# Patient Record
Sex: Male | Born: 1950
Health system: Southern US, Community
[De-identification: ages and names within clinical notes are randomized; demographics above are authoritative.]

## PROBLEM LIST (undated history)

## (undated) DIAGNOSIS — E785 Hyperlipidemia, unspecified: Secondary | ICD-10-CM

## (undated) DIAGNOSIS — G4733 Obstructive sleep apnea (adult) (pediatric): Secondary | ICD-10-CM

## (undated) DIAGNOSIS — I10 Essential (primary) hypertension: Secondary | ICD-10-CM

## (undated) HISTORY — DX: Hyperlipidemia, unspecified: E78.5

## (undated) HISTORY — DX: Obstructive sleep apnea (adult) (pediatric): G47.33

## (undated) HISTORY — PX: CHOLECYSTECTOMY: SHX55

## (undated) HISTORY — DX: Essential (primary) hypertension: I10

---

## 2008-12-21 ENCOUNTER — Encounter: Payer: Self-pay | Admitting: Internal Medicine

## 2009-12-06 ENCOUNTER — Encounter: Payer: Self-pay | Admitting: Internal Medicine

## 2010-01-03 ENCOUNTER — Ambulatory Visit: Payer: Self-pay | Admitting: Internal Medicine

## 2010-01-03 DIAGNOSIS — R609 Edema, unspecified: Secondary | ICD-10-CM | POA: Insufficient documentation

## 2010-01-03 DIAGNOSIS — R0609 Other forms of dyspnea: Secondary | ICD-10-CM

## 2010-01-05 ENCOUNTER — Encounter: Payer: Self-pay | Admitting: Internal Medicine

## 2010-01-09 ENCOUNTER — Encounter: Payer: Self-pay | Admitting: Internal Medicine

## 2010-01-10 LAB — CONVERTED CEMR LAB

## 2010-02-02 ENCOUNTER — Encounter: Payer: Self-pay | Admitting: Internal Medicine

## 2010-03-05 ENCOUNTER — Telehealth: Payer: Self-pay | Admitting: Internal Medicine

## 2010-08-28 NOTE — Consult Note (Signed)
Summary: [facility]  [facility]   Imported By: Marylou Mccoy 01/19/2010 17:50:36  _____________________________________________________________________  External Attachment:    Type:   Image     Comment:   External Document

## 2010-08-28 NOTE — Progress Notes (Signed)
Summary: results of bloodwork, treadmill, and echo  Phone Note Call from Patient   Caller: Patient [phone] Reason for Call: Talk to Nurse, Lab or Test Results Summary of Call: echo,.treadmill and bloodwork results -[phone] Initial call taken by: Glynda Jaeger,  March 05, 2010 9:47 AM  Follow-up for Phone Call        pt's test were ordered by Dr Terrilee Croak office, but Dr Gala Romney was suppose to get copies to reveiw and plan next step, have called Dr Terrilee Croak office and requested test results, they will fax (spoke w/Donna) Meredith Staggers, RN  March 05, 2010 4:55 PM   pt is aware we are waiting on Dr Terrilee Croak office to fax results, he states he talked w/them earlier and Lupita Leash stated she had faxed them, will look through paperwork if not found will call them in am to get refaxed Meredith Staggers, RN  March 07, 2010 4:56 PM   Additional Follow-up for Phone Call Additional follow up Details #1::        have received test results from Dr Terrilee Croak office, have placed on Dr Bensimhon's cart for review Meredith Staggers, RN  March 08, 2010 7:57 AM  DR Bensimhon reveiwed test (see append to his ov note) pt is aware Meredith Staggers, RN  March 13, 2010 1:00 PM

## 2010-08-28 NOTE — Letter (Signed)
Summary: [facility]  [facility]   Imported By: Marylou Mccoy 05/10/2010 15:08:58  _____________________________________________________________________  External Attachment:    Type:   Image     Comment:   External Document

## 2010-08-28 NOTE — Assessment & Plan Note (Signed)
Summary: np6/Hyperlipidemia-mb   Primary Provider:  Dr.  Blenda Nicely  CC:  new patient with hyperlipidemia.  Marland Kitchen  History of Present Illness: Kyle Fritz is a 60 y/o male with h/o morbid obesity, OSA, HTN and hyperlipidemia.   Referred by Dr. Blenda Nicely for further evaluation of dyspnea and LE edema. Denies any known h/o CAD. Had screening treadmill test about 5 years ago which was normal. Was supposed to do another stress test with Dr. Blenda Nicely a few months ago but got postponed due to equipment issues.   Has been having exertional dyspnea. Did walk test with Dr. Blenda Nicely and no hypoxia. SOB is episodic. Notes it most when walking. Can go about 1/2 mile then has to stop. Occasional chest tightness not cleraly related to exertion. Mild LE edema. No orthopnea or PND.   Current Medications (verified): 1)  Zetia 10 Mg Tabs (Ezetimibe) .... Once Daily 2)  Atenolol 25 Mg Tabs (Atenolol) .... Take One Tablet Once Daily 3)  Venlafaxine Hcl 150 Mg Xr24h-Cap (Venlafaxine Hcl) .... Once Daily 4)  Lipitor 20 Mg Tabs (Atorvastatin Calcium) .... Take One Tablet Once Daily 5)  Nasonex 50 Mcg/act Susp (Mometasone Furoate) .... 2 Sprays Each Nostril Each Morning 6)  Proventil Hfa 108 (90 Base) Mcg/act Aers (Albuterol Sulfate) .... 2 Puffs Daily  Allergies (verified): No Known Drug Allergies  Past History:  Past Surgical History: Last updated: 01/02/2010 Cholecystectomy  Family History: Last updated: 01/03/2010 Father: Hiatal hernia No CAD  Social History: Last updated: 01/03/2010 Full Time for phone company Married 3 adopted kids Tobacco Use - No.  Alcohol Use - no  Risk Factors: Smoking Status: never (01/02/2010)  Past Medical History: Morbid Obesity Hyperlipidemia Obstuctive Sleep Apnea Migraine headache Depression  Family History: Reviewed history from 01/02/2010 and no changes required. Father: Hiatal hernia No CAD  Social History: Reviewed history from 01/02/2010 and no changes  required. Full Time for phone company Married 3 adopted kids Tobacco Use - No.  Alcohol Use - no  Review of Systems       As per HPI and past medical history; otherwise all systems negative.   Vital Signs:  Patient profile:   60 year old male Height:      76 inches Weight:      341 pounds BMI:     41.66 Pulse rate:   103 / minute Pulse rhythm:   regular BP sitting:   134 / 74  (left arm) Cuff size:   large  Vitals Entered By: Judithe Modest CMA (January 03, 2010 4:16 PM)  Physical Exam  General:  ambulates with no resp difficulty HEENT: normal Neck: supple. JVP hard to evaluate due to size. Carotids 2+ bilat; no bruits. No lymphadenopathy or thryomegaly appreciated. Cor: PMI nondisplaced. Regular rate & rhythm. No rubs, gallops, murmur. Lungs: clear Abdomen: obese soft, nontender, nondistended.Good bowel sounds. Extremities: no cyanosis, clubbing, rash,  1+ edema Neuro: alert & orientedx3, cranial nerves grossly intact. moves all 4 extremities w/o difficulty. affect pleasant    Impression & Recommendations:  Problem # 1:  DYSPNEA (ICD-786.05) Likely multifactorial. Given risk factors and chest tightness, agree with Dr. Terrilee Croak plan for nuclear stress test. Would also get echo to assess LV and RV function (systolic and diastolic) and pulmonary pressures. If testing abnormal can f/u with cath as needed. Obviously, weight loss and increased physical activity will be key in alleviating his symptoms.   Problem # 2:  EDEMA (ICD-782.3)  Patient Instructions: 1)  Start Furosemide 20mg  every other day  2)  Start Potassium every other day  3)  Labs in [place] in 1 week (bmet, bnp) Prescriptions: POTASSIUM CHLORIDE CRYS CR 20 MEQ CR-TABS (POTASSIUM CHLORIDE CRYS CR) Take one tablet by mouth every other day  #30 x 6   Entered by:   Meredith Staggers, RN   Authorized by:   Dolores Patty, MD, [facility]   Signed by:   Meredith Staggers, RN on 01/03/2010   Method used:    Electronically to        [facility]. [phone]* (retail)       [address]       [place], [place]  [address]       Ph: [phone] or [phone]       Fax: [phone]   RxID:   [id number] FUROSEMIDE 20 MG TABS (FUROSEMIDE) Take one tablet by mouth every other day  #30 x 6   Entered by:   Meredith Staggers, RN   Authorized by:   Dolores Patty, MD, [facility]   Signed by:   Meredith Staggers, RN on 01/03/2010   Method used:   Electronically to        [facility] [phone]* (retail)       [address]       [place], [place]  [address]       Ph: [phone] or [phone]       Fax: [phone]   RxID:   [id number]   Appended Document: np6/Hyperlipidemia-mb Studies from Dr. Terrilee Croak office:  ECHO 02/26/10: poor quality due to habitus. LVEF 70%. RV not commented on. LA normal size. TV not well seen.  CPX 02/02/2010: pVO2 12.4 (corrected to BMI 30 = 18 ml/kg/min). stress ECG normal  FEV1 = 2.19 (55%)  FVC 2.49 (47%)   Overall: Dyspnea seems to be most related to obesity and associated restrictive lung physiology. No evidence of ischemia on exercise stress.   Plan: Recommend aggressive wt loss program with diet and exercise. Consider repeat echo in 6 months if symptoms persist.   Appended Document: np6/Hyperlipidemia-mb pt aware

## 2010-08-28 NOTE — Letter (Signed)
Summary: [facility]  [facility]   Imported By: Marylou Mccoy 01/19/2010 17:55:56  _____________________________________________________________________  External Attachment:    Type:   Image     Comment:   External Document

## 2016-09-04 ENCOUNTER — Ambulatory Visit (INDEPENDENT_AMBULATORY_CARE_PROVIDER_SITE_OTHER): Payer: 59 | Admitting: Internal Medicine

## 2016-09-04 ENCOUNTER — Encounter: Payer: Self-pay | Admitting: Internal Medicine

## 2016-09-04 VITALS — BP 150/80 | HR 75 | Ht 76.0 in | Wt 367.0 lb

## 2016-09-04 DIAGNOSIS — R0609 Other forms of dyspnea: Secondary | ICD-10-CM

## 2016-09-04 MED ORDER — FAMOTIDINE 20 MG PO TABS: ORAL_TABLET | ORAL | 11 refills | Status: DC

## 2016-09-04 MED ORDER — PANTOPRAZOLE SODIUM 40 MG PO TBEC: 40.0000 mg | DELAYED_RELEASE_TABLET | Freq: Every day | ORAL | 2 refills | Status: DC

## 2016-09-04 NOTE — Patient Instructions (Addendum)
Change the symbicort to where you only take it when you need it   Pantoprazole (protonix) 40 mg   Take  30-60 min before first meal of the day and Pepcid (famotidine)  20 mg one @  bedtime until return to office - this is the best way to tell whether stomach acid is contributing to your problem.    GERD (REFLUX)  is an extremely common cause of respiratory symptoms just like yours , many times with no obvious heartburn at all    It can be treated with medication, but also with lifestyle changes including elevation of the head of your bed (ideally with 6 inch  bed blocks),  Smoking cessation, avoidance of late meals, excessive alcohol, and avoid fatty foods, chocolate, peppermint, colas, red wine, and acidic juices such as orange juice.  NO MINT OR MENTHOL PRODUCTS SO NO COUGH DROPS   USE SUGARLESS CANDY INSTEAD (Jolley ranchers or Stover's or Life Savers) or even ice chips will also do - the key is to swallow to prevent all throat clearing. NO OIL BASED VITAMINS - use powdered substitutes.    Please schedule a follow up office visit in 4 weeks, sooner if needed  Cxr/ labs ordered 09/04/2016 but did not go > will have him do on return

## 2016-09-04 NOTE — Progress Notes (Signed)
Subjective:     Patient ID: Kyle Fritz, male   DOB: February 12, 1951,   MRN: [id number]  HPI  67 yowm  Quit smoking 1982 formerly jogged and did fine until around 2015 with onset on doe with activity but fine at hs with cpap per Chodri and progressive decline since so referred to pulmonary clinic 09/04/2016 by Dr  Helene Kelp.   09/04/2016 1st [facility] office visit/ Wert   Chief Complaint  Patient presents with  . Pulmonary Consult    Referred by Dr. Helene Kelp. Pt c/o SOB for the past several months. He states that he feels SOB "all the time" with or without any exertion.    onset around 2015 p maybe 10-15 lb gain assoc only with cough= intermittent daytime dry hack day > noct assoc with daytime nasal congestion and lots of white mucus production each am Started on symb sev years prior to [place] sporadically  And then one week prior to [place] to symb 160 2bid no better flonase helps nasal symptoms in spring and fall  Cp eval was 2017 with neg cath and has not recurred - midline  Thru back  Hot or cold food causes choking    No obvious day to day or daytime variability or assoc excess/ purulent sputum or mucus plugs or hemoptysis or  chest tightness, subjective wheeze or overt sinus or hb symptoms. No unusual exp hx or h/o childhood pna/ asthma or knowledge of premature birth.  Sleeping ok without nocturnal  or early am exacerbation  of respiratory  c/o's or need for noct saba. Also denies any obvious fluctuation of symptoms with weather or environmental changes or other aggravating or alleviating factors except as outlined above   Current Medications, Allergies, Complete Past Medical History, Past Surgical History, Family History, and Social History were reviewed in [employer] record.  ROS  The following are not active complaints unless bolded sore throat, dysphagia, dental problems, itching, sneezing,  nasal congestion or excess/ purulent secretions, ear ache,   fever,  chills, sweats, unintended wt loss, classically pleuritic or exertional cp,  orthopnea pnd or leg swelling, presyncope, palpitations, abdominal pain, anorexia, nausea, vomiting, diarrhea  or change in bowel or bladder habits, change in stools or urine, dysuria,hematuria,  rash, arthralgias, visual complaints, headache, numbness, weakness or ataxia or problems with walking or coordination,  change in mood/affect or memory.        Commitment     Review of Systems     Objective:   Physical Exam amb wm nad  Wt Readings from Last 3 Encounters:  09/04/16 (!) 367 lb (166.5 kg)  01/03/10 (!) 341 lb (154.7 kg)    Vital signs reviewed - Note on arrival 02 sats  95% on RA     HEENT: nl dentition, turbinates, and oropharynx. Nl external ear canals without cough reflex Modified Mallampati Score =   2    NECK :  without JVD/Nodes/TM/ nl carotid upstrokes bilaterally   LUNGS: no acc muscle use,  Nl contour chest which is clear to A and P bilaterally without cough on insp or exp maneuvers   CV:  RRR  no s3 or murmur or increase in P2, nad no edema   ABD:  soft and nontender with nl inspiratory excursion in the supine position. No bruits or organomegaly appreciated, bowel sounds nl  MS:  Nl gait/ ext warm without deformities, calf tenderness, cyanosis or clubbing No obvious joint restrictions   SKIN: warm  and dry without lesions    NEURO:  alert, approp, nl sensorium with  no motor or cerebellar deficits apparent.     Cxr/ labs ordered 09/04/2016 but did not go > will have him do on return       Assessment:

## 2016-09-05 NOTE — Assessment & Plan Note (Addendum)
09/04/2016   Walked RA  2 laps @ 185 ft each stopped due to  Sob at fast pace, sats still 90%  - Spirometry 09/04/2016  FEV1 2.67 (61%)  Ratio 81     When respiratory symptoms begin or become refractory well after a patient reports complete smoking cessation,  Especially when this wasn't the case while they were smoking, as is the case here, a red flag is raised based on the work of Dr Kris Mouton which states:  if you quit smoking when your best day FEV1 is still well preserved it is highly unlikely you will progress to severe disease.  That is to say, once the smoking stops,  the symptoms should not suddenly erupt or markedly worsen.  If so, the differential diagnosis should include  obesity/deconditioning,  LPR/Reflux/Aspiration syndromes,  occult CHF, or  especially side effect of medications commonly used in this population.    As expected, the pfts don't show significant copd though he could have an asthmatic component and could try on vs off symbicort to see what difference if any it makes  The more likely explanation for his cp with neg cards w/u and hacking non-prod day > noct cough is gerd assoc with obesity/ deconditioning but he needs to return to complete the w/u as did not go to cxr/ lab as requested  Total time devoted to counseling  > 50 % of initial 60 min office visit:  review case with pt/ discussion of options/alternatives/ personally creating written customized instructions  in presence of pt  then going over those specific  Instructions directly with the pt including how to use all of the meds but in particular covering each new medication in detail and the difference between the maintenance= "automatic" meds and the prns using an action plan format for the latter (If this problem/symptom => do that organization reading Left to right).  Please see AVS from this visit for a full list of these instructions which I personally wrote for this pt and  are unique to this visit.

## 2016-09-16 ENCOUNTER — Ambulatory Visit: Payer: 59

## 2016-09-20 ENCOUNTER — Ambulatory Visit
Admission: RE | Admit: 2016-09-20 | Discharge: 2016-09-20 | Disposition: A | Discharge status: Home / Self Care | Payer: 59 | Source: Ambulatory Visit | Attending: Internal Medicine | Admitting: Internal Medicine

## 2016-09-20 DIAGNOSIS — R0609 Other forms of dyspnea: Principal | ICD-10-CM

## 2016-10-03 ENCOUNTER — Other Ambulatory Visit (INDEPENDENT_AMBULATORY_CARE_PROVIDER_SITE_OTHER): Payer: 59

## 2016-10-03 ENCOUNTER — Ambulatory Visit (INDEPENDENT_AMBULATORY_CARE_PROVIDER_SITE_OTHER)
Admission: RE | Admit: 2016-10-03 | Discharge: 2016-10-03 | Disposition: A | Discharge status: Home / Self Care | Payer: 59 | Source: Ambulatory Visit | Attending: Internal Medicine | Admitting: Internal Medicine

## 2016-10-03 ENCOUNTER — Ambulatory Visit (INDEPENDENT_AMBULATORY_CARE_PROVIDER_SITE_OTHER): Payer: 59 | Admitting: Internal Medicine

## 2016-10-03 ENCOUNTER — Encounter: Payer: Self-pay | Admitting: Internal Medicine

## 2016-10-03 VITALS — BP 134/84 | HR 60 | Ht 76.0 in | Wt 361.0 lb

## 2016-10-03 DIAGNOSIS — R0609 Other forms of dyspnea: Secondary | ICD-10-CM

## 2016-10-03 LAB — BASIC METABOLIC PANEL
BUN: 21 mg/dL (ref 6–23)
CHLORIDE: 104 meq/L (ref 96–112)
CO2: 29 meq/L (ref 19–32)
CREATININE: 0.99 mg/dL (ref 0.40–1.50)
Calcium: 10.1 mg/dL (ref 8.4–10.5)
GFR: 80.78 mL/min (ref >60.00)
Glucose, Bld: 119 mg/dL — ABNORMAL HIGH (ref 70–99)
Potassium: 4.9 mEq/L (ref 3.5–5.1)
SODIUM: 140 meq/L (ref 135–145)

## 2016-10-03 LAB — CBC WITH DIFFERENTIAL/PLATELET
BASOS PCT: 0.8 % (ref 0.0–3.0)
Basophils Absolute: 0.1 10*3/uL (ref 0.0–0.1)
EOS ABS: 0.2 10*3/uL (ref 0.0–0.7)
EOS PCT: 2.2 % (ref 0.0–5.0)
HCT: 42.2 % (ref 39.0–52.0)
HEMOGLOBIN: 14.4 g/dL (ref 13.0–17.0)
LYMPHS ABS: 2.1 10*3/uL (ref 0.7–4.0)
Lymphocytes Relative: 24.1 % (ref 12.0–46.0)
MCHC: 34.2 g/dL (ref 30.0–36.0)
MCV: 85.8 fl (ref 78.0–100.0)
MONO ABS: 0.6 10*3/uL (ref 0.1–1.0)
Monocytes Relative: 7.1 % (ref 3.0–12.0)
NEUTROS ABS: 5.7 10*3/uL (ref 1.4–7.7)
NEUTROS PCT: 65.8 % (ref 43.0–77.0)
PLATELETS: 206 10*3/uL (ref 150.0–400.0)
RBC: 4.91 Mil/uL (ref 4.22–5.81)
RDW: 14.9 % (ref 11.5–15.5)
WBC: 8.6 10*3/uL (ref 4.0–10.5)

## 2016-10-03 LAB — TSH: TSH: 1.94 u[IU]/mL (ref 0.35–4.50)

## 2016-10-03 LAB — NITRIC OXIDE: Nitric Oxide: 42

## 2016-10-03 LAB — D-DIMER, QUANTITATIVE (NOT AT ARMC): D DIMER QUANT: 1.65 ug{FEU}/mL — AB (ref <0.50)

## 2016-10-03 LAB — BRAIN NATRIURETIC PEPTIDE: Pro B Natriuretic peptide (BNP): 7 pg/mL (ref 0.0–100.0)

## 2016-10-03 NOTE — Patient Instructions (Addendum)
Please remember to go to the lab and x-ray department downstairs in the basement  for your tests - we will call you with the results when they are available.   Only use your albuterol as a rescue medication to be used if you can't catch your breath by resting or doing a relaxed purse lip breathing pattern.  - The less you use it, the better it will work when you need it. - Ok to use up to 2 puffs  every 4 hours if you must but call for immediate appointment if use goes up over your usual need - Don't leave home without it !!  (think of it like the spare tire for your car)   Weight control is simply a matter of calorie balance which needs to be tilted in your favor by eating less and exercising more.  To get the most out of exercise, you need to be continuously aware that you are short of breath, but never out of breath, for 30 minutes daily. As you improve, it will actually be easier for you to do the same amount of exercise  in  30 minutes so always push to the level where you are short of breath.  If this does not result in gradual weight reduction then I strongly recommend you see a nutritionist with a food diary x 2 weeks so that we can work out a negative calorie balance which is universally effective in steady weight loss programs.  Think of your calorie balance like you do your bank account where in this case you want the balance to go down so you must take in less calories than you burn up.  It's just that simple:  Hard to do, but easy to understand.  Good luck!   .Please schedule a follow up office visit in 6 weeks, call sooner if needed with full pfts on return

## 2016-10-03 NOTE — Progress Notes (Signed)
LMTCB on home number  ATC mobile and NA "not a working number"

## 2016-10-03 NOTE — Assessment & Plan Note (Addendum)
09/04/2016   Walked RA  2 laps @ 185 ft each stopped due to  Sob at fast pace, sats still 90%  - Spirometry 09/04/2016  FEV1 2.67 (61%)  Ratio 81 - 10/03/2016  Walked RA x 3 laps @ 185 ft each stopped due to  End of study,  desat to 89% with no sob at nl pace     - FENO 10/03/2016  =   42 off symbicort  Since 09/04/16  - D dimer 10/03/2016  = 1.65 >  CTa rec along with venous doppler   He is high risk occult dvt/ PE from severe obesity so no choice with elevated d dimer but proceed with above studies.  No evidence of thyroid dz/ chf/ anemia / renal dz or active asthma  causing sob   I had an extended discussion with the patient reviewing all relevant studies completed to date and  lasting 15 to 20 minutes of a 25 minute visit    Each maintenance medication was reviewed in detail including most importantly the difference between maintenance and prns and under what circumstances the prns are to be triggered using an action plan format that is not reflected in the computer generated alphabetically organized AVS.    Please see AVS for specific instructions unique to this visit that I personally wrote and verbalized to the the pt in detail and then reviewed with pt  by my nurse highlighting any  changes in therapy recommended at today's visit to their plan of care.

## 2016-10-03 NOTE — Assessment & Plan Note (Signed)
Body mass index is 43.94 kg/m.  Trending  Down/ encouraged Lab Results  Component Value Date   TSH 1.94 10/03/2016     Contributing to gerd risk/ doe/reviewed the need and the process to achieve and maintain neg calorie balance > defer f/u primary care including intermittently monitoring thyroid status

## 2016-10-03 NOTE — Progress Notes (Signed)
Spoke with pt and notified of results per Dr. Wert. Pt verbalized understanding and denied any questions. 

## 2016-10-03 NOTE — Progress Notes (Signed)
Subjective:     Patient ID: Kyle Fritz, male   DOB: 1950-09-28,   MRN: [id number]    Brief patient profile:  33 yowm  Quit smoking 1982 formerly jogged last around 2000 @ wt 230-240 and did fine until around 2015 with onset on doe with activity but fine at hs with cpap per Chodri and progressive decline since so referred to pulmonary clinic 09/04/2016 by Dr  Helene Kelp.     History of Present Illness  09/04/2016 1st [facility] office visit/ Greely Atiyeh   Chief Complaint  Patient presents with  . Pulmonary Consult    Referred by Dr. Helene Kelp. Pt c/o SOB for the past several months. He states that he feels SOB "all the time" with or without any exertion.    onset around 2015 p maybe 10-15 lb gain assoc only with cough= intermittent daytime dry hack day > noct assoc with daytime nasal congestion and lots of white mucus production each am Started on symb sev years prior to [place] sporadically  And then one week prior to [place] to symb 160 2bid no better flonase helps nasal symptoms in spring and fall  Cp eval was 2017 with neg cath and has not recurred - midline  Thru back  Hot or cold food causes choking  rec Change the symbicort to where you only take it when you need it Pantoprazole (protonix) 40 mg   Take  30-60 min before first meal of the day and Pepcid (famotidine)  20 mg one @  bedtime until return to office - this is the best way to tell whether stomach acid is contributing to your problem.   GERD diet   Please schedule a follow up office visit in 4 weeks, sooner if needed  Cxr/ labs ordered 09/04/2016 but did not go > will have him do on return     10/03/2016  f/u ov/Taylynn Easton re: unexplained sob  Chief Complaint  Patient presents with  . Follow-up    Breathing is unchanged. He has noticed cough and wheezing for the past several days. Cough is prod with clear sputum.  no change off symbicort  Sleeps fine on cpap  Using proventil bid not prn as rec  Doe = MMRC3 = can't walk 100 yards  even at a slow pace at a flat grade s stopping due to sob     No obvious day to day or daytime variability or assoc purulent sputum or mucus plugs or hemoptysis or cp or chest tightness, subjective wheeze or overt sinus or hb symptoms. No unusual exp hx or h/o childhood pna/ asthma or knowledge of premature birth.  Sleeping ok without nocturnal  or early am exacerbation  of respiratory  c/o's or need for noct saba. Also denies any obvious fluctuation of symptoms with weather or environmental changes or other aggravating or alleviating factors except as outlined above   Current Medications, Allergies, Complete Past Medical History, Past Surgical History, Family History, and Social History were reviewed in [employer] record.  ROS  The following are not active complaints unless bolded sore throat, dysphagia, dental problems, itching, sneezing,  nasal congestion or excess/ purulent secretions, ear ache,   fever, chills, sweats, unintended wt loss, classically pleuritic or exertional cp,  orthopnea pnd or leg swelling sym bilaterally, better in am's, presyncope, palpitations, abdominal pain, anorexia, nausea, vomiting, diarrhea  or change in bowel or bladder habits, change in stools or urine, dysuria,hematuria,  rash, arthralgias, visual complaints, headache,  numbness, weakness or ataxia or problems with walking or coordination,  change in mood/affect or memory.           Objective:  Physical Exam  amb wm nad  10/03/2016           361   09/04/16 (!) 367 lb (166.5 kg)  01/03/10 (!) 341 lb (154.7 kg)    Vital signs reviewed - Note on arrival 02 sats  97% on RA     HEENT: nl dentition, turbinates, and oropharynx. Nl external ear canals without cough reflex Modified Mallampati Score =   2    NECK :  without JVD/Nodes/TM/ nl carotid upstrokes bilaterally   LUNGS: no acc muscle use,  Nl contour chest which is clear to A and P bilaterally without cough on insp or exp  maneuvers   CV:  RRR  no s3 or murmur or increase in P2, nad  - trace pitting edema both legs   ABD:  soft and nontender with nl inspiratory excursion in the supine position. No bruits or organomegaly appreciated, bowel sounds nl  MS:  Nl gait/ ext warm without deformities, calf tenderness, cyanosis or clubbing No obvious joint restrictions   SKIN: warm and dry without lesions    NEURO:  alert, approp, nl sensorium with  no motor or cerebellar deficits apparent.      CXR PA and Lateral:   10/03/2016 :    I personally reviewed images and agree with radiology impression as follows:    The heart size and mediastinal contours are within normal limits. Both lungs are clear. No pneumothorax or pleural effusion is noted. The visualized skeletal structures are unremarkable.    Labs ordered/ reviewed:      Chemistry      Component Value Date/Time   NA 140 10/03/2016 0947   K 4.9 10/03/2016 0947   CL 104 10/03/2016 0947   CO2 29 10/03/2016 0947   BUN 21 10/03/2016 0947   CREATININE 0.99 10/03/2016 0947      Component Value Date/Time   CALCIUM 10.1 10/03/2016 0947        Lab Results  Component Value Date   WBC 8.6 10/03/2016   HGB 14.4 10/03/2016   HCT 42.2 10/03/2016   MCV 85.8 10/03/2016   PLT 206.0 10/03/2016     Lab Results  Component Value Date   DDIMER 1.65 (H) 10/03/2016      Lab Results  Component Value Date   TSH 1.94 10/03/2016     Lab Results  Component Value Date   PROBNP 7.0 10/03/2016                  Assessment:

## 2016-10-04 ENCOUNTER — Telehealth: Payer: Self-pay | Admitting: Internal Medicine

## 2016-10-04 ENCOUNTER — Ambulatory Visit (INDEPENDENT_AMBULATORY_CARE_PROVIDER_SITE_OTHER)
Admission: RE | Admit: 2016-10-04 | Discharge: 2016-10-04 | Disposition: A | Discharge status: Home / Self Care | Payer: 59 | Source: Ambulatory Visit | Attending: Internal Medicine | Admitting: Internal Medicine

## 2016-10-04 ENCOUNTER — Other Ambulatory Visit: Payer: Self-pay | Admitting: Internal Medicine

## 2016-10-04 DIAGNOSIS — R0609 Other forms of dyspnea: Secondary | ICD-10-CM | POA: Diagnosis not present

## 2016-10-04 LAB — RESPIRATORY ALLERGY PROFILE REGION II ~~LOC~~
Allergen, C. Herbarum, M2: <0.1 kU/L
Allergen, Cedar tree, t12: <0.1 kU/L
Allergen, Comm Silver Birch, t9: <0.1 kU/L
Allergen, Cottonwood, t14: <0.1 kU/L
Allergen, Mulberry, t76: <0.1 kU/L
Allergen, Oak,t7: <0.1 kU/L
Allergen, P. notatum, m1: <0.1 kU/L
Aspergillus fumigatus, m3: <0.1 kU/L
Bermuda Grass: <0.1 kU/L
Box Elder IgE: <0.1 kU/L
Cat Dander: <0.1 kU/L
Cockroach: <0.1 kU/L
Common Ragweed: <0.1 kU/L
IgE (Immunoglobulin E), Serum: 57 kU/L (ref <115)
Johnson Grass: <0.1 kU/L
Rough Pigweed  IgE: <0.1 kU/L
Sheep Sorrel IgE: <0.1 kU/L

## 2016-10-04 MED ORDER — IOPAMIDOL (ISOVUE-370) INJECTION 76%
80.0000 mL | Freq: Once | INTRAVENOUS | Status: AC | PRN
  Administered 2016-10-04: 80 mL via INTRAVENOUS

## 2016-10-04 NOTE — Progress Notes (Signed)
Spoke with pt and notified of results per Dr. Wert. Pt verbalized understanding and denied any questions. 

## 2016-10-04 NOTE — Telephone Encounter (Signed)
Notes Recorded by Tanda Rockers, MD on 10/04/2016 at 1:40 PM EST Call patient : Study is unremarkable, no change in recs --------------------------- Spoke with pt. He is aware of results. Nothing further was needed.

## 2016-10-07 ENCOUNTER — Ambulatory Visit (HOSPITAL_COMMUNITY)
Admission: RE | Admit: 2016-10-07 | Discharge: 2016-10-07 | Disposition: A | Discharge status: Home / Self Care | Payer: 59 | Source: Ambulatory Visit | Attending: Internal Medicine | Admitting: Internal Medicine

## 2016-10-07 DIAGNOSIS — R0609 Other forms of dyspnea: Secondary | ICD-10-CM | POA: Diagnosis present

## 2016-10-07 NOTE — Progress Notes (Signed)
*  PRELIMINARY RESULTS* Vascular Ultrasound Bilateral lower extremity venous duplex has been completed.  Preliminary findings: No evidence of deep vein thrombosis or baker's cysts bilaterally.  Small palpable hypoechoic area seen left lateral calf, unknown etiology.   Everrett Coombe 10/07/2016, 9:34 AM

## 2016-10-08 ENCOUNTER — Other Ambulatory Visit: Payer: Self-pay | Admitting: Internal Medicine

## 2016-10-08 DIAGNOSIS — R0609 Other forms of dyspnea: Principal | ICD-10-CM

## 2016-10-08 MED ORDER — PANTOPRAZOLE SODIUM 40 MG PO TBEC: 40.0000 mg | DELAYED_RELEASE_TABLET | Freq: Every day | ORAL | 3 refills | Status: DC

## 2016-10-08 MED ORDER — FAMOTIDINE 20 MG PO TABS: ORAL_TABLET | ORAL | 3 refills | Status: DC

## 2016-10-08 NOTE — Progress Notes (Signed)
Spoke with pt and notified of results per Dr. Wert. Pt verbalized understanding and denied any questions. 

## 2016-11-25 ENCOUNTER — Ambulatory Visit (INDEPENDENT_AMBULATORY_CARE_PROVIDER_SITE_OTHER): Payer: 59 | Admitting: Internal Medicine

## 2016-11-25 ENCOUNTER — Encounter: Payer: Self-pay | Admitting: Internal Medicine

## 2016-11-25 VITALS — BP 126/74 | HR 67 | Ht 74.5 in | Wt 351.0 lb

## 2016-11-25 DIAGNOSIS — R0609 Other forms of dyspnea: Secondary | ICD-10-CM | POA: Diagnosis not present

## 2016-11-25 LAB — PULMONARY FUNCTION TEST
DL/VA % PRED: 94 %
DL/VA: 4.58 ml/min/mmHg/L
DLCO UNC % PRED: 66 %
DLCO cor % pred: 66 %
DLCO cor: 25.45 ml/min/mmHg
DLCO unc: 25.52 ml/min/mmHg
FEF 25-75 POST: 3.13 L/s
FEF 25-75 Pre: 2.64 L/sec
FEF2575-%CHANGE-POST: 18 %
FEF2575-%Pred-Post: 98 %
FEF2575-%Pred-Pre: 82 %
FEV1-%CHANGE-POST: 4 %
FEV1-%Pred-Post: 70 %
FEV1-%Pred-Pre: 67 %
FEV1-Post: 2.83 L
FEV1-Pre: 2.71 L
FEV1FVC-%Change-Post: 5 %
FEV1FVC-%Pred-Pre: 107 %
FEV6-%Change-Post: 0 %
FEV6-%Pred-Post: 64 %
FEV6-%Pred-Pre: 65 %
FEV6-POST: 3.33 L
FEV6-Pre: 3.36 L
FEV6FVC-%Pred-Post: 105 %
FEV6FVC-%Pred-Pre: 105 %
FVC-%Change-Post: 0 %
FVC-%PRED-PRE: 62 %
FVC-%Pred-Post: 61 %
FVC-Post: 3.33 L
FVC-Pre: 3.36 L
PRE FEV1/FVC RATIO: 81 %
PRE FEV6/FVC RATIO: 100 %
Post FEV1/FVC ratio: 85 %
Post FEV6/FVC ratio: 100 %
RV % pred: 94 %
RV: 2.43 L
TLC % PRED: 75 %
TLC: 5.95 L

## 2016-11-25 NOTE — Assessment & Plan Note (Addendum)
09/04/2016   Walked RA  2  laps @ 185 ft each stopped due to  Sob at fast pace, sats still 90%  - Spirometry 09/04/2016  FEV1 2.67 (61%)  Ratio 81 - 10/03/2016  Walked RA x 3 laps @ 185 ft each stopped due to  End of study,  desat to 89% with no sob at nl pace     - FENO 10/03/2016  =   42 off symbicort  Since 09/04/16  - Allergy profile 10/03/16  >  Eos 0.2 /  IgE  57 RAST neg  - D dimer 10/03/2016  = 1.65 >> >  CTa  10/04/2016 1. No evidence of acute pulmonary embolus. 2. No acute findings in the chest aside from pulmonary atelectasis. 3. Incidental benign right adrenal adenoma >> > Venous dopplers neg bilaterally 10/07/16    - PFT's  11/25/2016  FEV1 2.83 (70 % ) ratio 85  p 4 % improvement from saba p nothing prior to study with DLCO  66/66c % corrects to 94  % for alv volume but ERV 18    I had an extended final summary discussion with the patient reviewing all relevant studies completed to date and  lasting 15 to 20 minutes of a 25 minute visit on the following issues:   Findings are c/w effects of obesity, not copd/ though difficult to rule out asthma   Ok to use prn albuterol   No pulmonary f/u needed

## 2016-11-25 NOTE — Progress Notes (Signed)
PFT done today. 

## 2016-11-25 NOTE — Progress Notes (Signed)
Subjective:     Patient ID: Kyle Fritz, male   DOB: [birth year],   MRN: [id number]    Brief patient profile:  73 yowm  Quit smoking 1982 formerly jogged last around 2000 @ wt 230-240 and did fine until around 2015 with onset on doe with activity but fine at hs with cpap per Chodri and progressive decline since so referred to pulmonary clinic 09/04/2016 by Dr  Helene Kelp.     History of Present Illness  09/04/2016 1st [facility] office visit/ Kyle Fritz   Chief Complaint  Patient presents with  . Pulmonary Consult    Referred by Dr. Helene Kelp. Pt c/o SOB for the past several months. He states that he feels SOB "all the time" with or without any exertion.    onset around 2015 p maybe 10-15 lb gain assoc only with cough= intermittent daytime dry hack day > noct assoc with daytime nasal congestion and lots of white mucus production each am Started on symb sev years prior to [place] sporadically  And then one week prior to [place] to symb 160 2bid no better flonase helps nasal symptoms in spring and fall  Cp eval was 2017 with neg cath and has not recurred - midline  Thru back  Hot or cold food causes choking  rec Change the symbicort to where you only take it when you need it Pantoprazole (protonix) 40 mg   Take  30-60 min before first meal of the day and Pepcid (famotidine)  20 mg one @  bedtime until return to office - this is the best way to tell whether stomach acid is contributing to your problem.   GERD diet   Please schedule a follow up office visit in 4 weeks, sooner if needed  Cxr/ labs ordered 09/04/2016 but did not go > will have him do on return     10/03/2016  f/u ov/Kyle Fritz re: unexplained sob  Chief Complaint  Patient presents with  . Follow-up    Breathing is unchanged. He has noticed cough and wheezing for the past several days. Cough is prod with clear sputum.  no change off symbicort  Sleeps fine on cpap  Using proventil bid not prn as rec  Doe = MMRC3 = can't walk 100 yards  even at a slow pace at a flat grade s stopping due to sob   rec Please remember to go to the lab and x-ray department downstairs in the basement  for your tests - we will call you with the results when they are available. Only use your albuterol as a rescue medication to be used if you can't catch your breath   Weight control is simply a matter of calorie balance    11/25/2016  f/u ov/Kyle Fritz re: obesity/ restrictive  Chief Complaint  Patient presents with  . Follow-up   No albuterol since last ov/ sleeping fine and improving ex activity tolerance with intended wt loss  No obvious day to day or daytime variability or assoc excess/ purulent sputum or mucus plugs or hemoptysis or cp or chest tightness, subjective wheeze or overt sinus or hb symptoms. No unusual exp hx or h/o childhood pna/ asthma or knowledge of premature birth.  Sleeping ok without nocturnal  or early am exacerbation  of respiratory  c/o's or need for noct saba. Also denies any obvious fluctuation of symptoms with weather or environmental changes or other aggravating or alleviating factors except as outlined above   Current Medications, Allergies, Complete Past  Medical History, Past Surgical History, Family History, and Social History were reviewed in [employer] record.  ROS  The following are not active complaints unless bolded sore throat, dysphagia, dental problems, itching, sneezing,  nasal congestion or excess/ purulent secretions, ear ache,   fever, chills, sweats, unintended wt loss, classically pleuritic or exertional cp,  orthopnea pnd or leg swelling, presyncope, palpitations, abdominal pain, anorexia, nausea, vomiting, diarrhea  or change in bowel or bladder habits, change in stools or urine, dysuria,hematuria,  rash, arthralgias, visual complaints, headache, numbness, weakness or ataxia or problems with walking or coordination,  change in mood/affect or memory.                       Objective:  Physical Exam  amb wm nad  11/25/2016         351  10/03/2016           361   09/04/16 (!) 367 lb (166.5 kg)  01/03/10 (!) 341 lb (154.7 kg)    Vital signs reviewed - Note on arrival 02 sats  98% on RA     HEENT: nl dentition, turbinates, and oropharynx. Nl external ear canals without cough reflex Modified Mallampati Score =   2    NECK :  without JVD/Nodes/TM/ nl carotid upstrokes bilaterally   LUNGS: no acc muscle use,  Nl contour chest which is clear to A and P bilaterally without cough on insp or exp maneuvers   CV:  RRR  no s3 or murmur or increase in P2, nad  - min bilateral pitting edema both legs elastic hose in place   ABD:  soft and nontender with nl inspiratory excursion in the supine position. No bruits or organomegaly appreciated, bowel sounds nl  MS:  Nl gait/ ext warm without deformities, calf tenderness, cyanosis or clubbing No obvious joint restrictions   SKIN: warm and dry without lesions    NEURO:  alert, approp, nl sensorium with  no motor or cerebellar deficits apparent.         Labs   reviewed:      Chemistry      Component Value Date/Time   NA 140 10/03/2016 0947   K 4.9 10/03/2016 0947   CL 104 10/03/2016 0947   CO2 29 10/03/2016 0947   BUN 21 10/03/2016 0947   CREATININE 0.99 10/03/2016 0947      Component Value Date/Time   CALCIUM 10.1 10/03/2016 0947        Lab Results  Component Value Date   WBC 8.6 10/03/2016   HGB 14.4 10/03/2016   HCT 42.2 10/03/2016   MCV 85.8 10/03/2016   PLT 206.0 10/03/2016     Lab Results  Component Value Date   DDIMER 1.65 (H) 10/03/2016      Lab Results  Component Value Date   TSH 1.94 10/03/2016     Lab Results  Component Value Date   PROBNP 7.0 10/03/2016             I personally reviewed images and agree with radiology impression as follows:  CTa Chest  10/04/16 1.  No evidence of acute pulmonary embolus. 2. No acute findings in the chest aside from pulmonary  atelectasis. 3. Incidental benign right adrenal adenoma.   Venous dopplers neg bilaterally 10/07/16 .       Assessment:   Outpatient Encounter Prescriptions as of 11/25/2016  Medication Sig  . atorvastatin (LIPITOR) 40 MG tablet Take 40 mg by  mouth daily.  . busPIRone (BUSPAR) 5 MG tablet Take 5 mg by mouth 2 (two) times daily.  Marland Kitchen desvenlafaxine (PRISTIQ) 100 MG 24 hr tablet Take 100 mg by mouth daily.  . famotidine (PEPCID) 20 MG tablet One at bedtime  . hydrochlorothiazide (MICROZIDE) 12.5 MG capsule Take 12.5 mg by mouth daily as needed.  . pantoprazole (PROTONIX) 40 MG tablet Take 1 tablet (40 mg total) by mouth daily. Take 30-60 min before first meal of the day  . traZODone (DESYREL) 100 MG tablet Take 100 mg by mouth at bedtime.  Marland Kitchen UNABLE TO FIND Med Name: CPAP   No facility-administered encounter medications on file as of 11/25/2016.

## 2016-11-25 NOTE — Assessment & Plan Note (Signed)
Body mass index is 44.46 kg/m.  -  trending down/ encouraged Lab Results  Component Value Date   TSH 1.94 10/03/2016     Contributing to gerd risk/ doe/reviewed the need and the process to achieve and maintain neg calorie balance > defer f/u primary care including intermittently monitoring thyroid status

## 2016-11-25 NOTE — Patient Instructions (Signed)
You do not have copd, probably don't have much asthma and most of the problem is related to the effects of the weight of your abdomen on your lung volumes  Ok to keep the albuterol on hand if can't catch your breath - if you find you start needing it more than a few times a week please return     If you are satisfied with your treatment plan,  let your doctor know and he/she can either refill your medications or you can return here when your prescription runs out.     If in any way you are not 100% satisfied,  please tell us.  If 100% better, tell your friends!  Pulmonary follow up is as needed

## 2017-04-21 DIAGNOSIS — J40 Bronchitis, not specified as acute or chronic: Secondary | ICD-10-CM | POA: Diagnosis not present

## 2017-04-21 DIAGNOSIS — Z6841 Body Mass Index (BMI) 40.0 and over, adult: Secondary | ICD-10-CM | POA: Diagnosis not present

## 2017-04-21 DIAGNOSIS — F418 Other specified anxiety disorders: Secondary | ICD-10-CM | POA: Diagnosis not present

## 2017-04-21 DIAGNOSIS — I10 Essential (primary) hypertension: Secondary | ICD-10-CM | POA: Diagnosis not present

## 2017-04-21 DIAGNOSIS — Z79899 Other long term (current) drug therapy: Secondary | ICD-10-CM | POA: Diagnosis not present

## 2017-04-21 DIAGNOSIS — E785 Hyperlipidemia, unspecified: Secondary | ICD-10-CM | POA: Diagnosis not present

## 2017-05-09 DIAGNOSIS — G4733 Obstructive sleep apnea (adult) (pediatric): Secondary | ICD-10-CM | POA: Diagnosis not present

## 2017-05-09 DIAGNOSIS — J310 Chronic rhinitis: Secondary | ICD-10-CM | POA: Diagnosis not present

## 2017-06-25 ENCOUNTER — Other Ambulatory Visit: Payer: Self-pay

## 2017-06-25 NOTE — Patient Outreach (Signed)
[facility]) Care Management  06/25/2017  Yadriel Kerrigan May 14, 1951 [id number]   [employer] High risk screen completed. No Care management needs identified.  Thea Silversmith, RN, MSN, [place] Coordinator Cell: [phone]

## 2017-06-28 DIAGNOSIS — J1000 Influenza due to other identified influenza virus with unspecified type of pneumonia: Secondary | ICD-10-CM | POA: Diagnosis not present

## 2017-06-28 DIAGNOSIS — J218 Acute bronchiolitis due to other specified organisms: Secondary | ICD-10-CM | POA: Diagnosis not present

## 2017-08-14 DIAGNOSIS — H524 Presbyopia: Secondary | ICD-10-CM | POA: Diagnosis not present

## 2017-10-30 DIAGNOSIS — Z6841 Body Mass Index (BMI) 40.0 and over, adult: Secondary | ICD-10-CM | POA: Diagnosis not present

## 2017-10-30 DIAGNOSIS — J189 Pneumonia, unspecified organism: Secondary | ICD-10-CM | POA: Diagnosis not present

## 2017-10-30 DIAGNOSIS — M25561 Pain in right knee: Secondary | ICD-10-CM | POA: Diagnosis not present

## 2017-11-06 DIAGNOSIS — M1711 Unilateral primary osteoarthritis, right knee: Secondary | ICD-10-CM | POA: Diagnosis not present

## 2017-11-06 DIAGNOSIS — M179 Osteoarthritis of knee, unspecified: Secondary | ICD-10-CM | POA: Insufficient documentation

## 2017-11-06 DIAGNOSIS — M25561 Pain in right knee: Secondary | ICD-10-CM | POA: Insufficient documentation

## 2017-11-06 DIAGNOSIS — G4733 Obstructive sleep apnea (adult) (pediatric): Secondary | ICD-10-CM | POA: Diagnosis not present

## 2017-11-06 DIAGNOSIS — J310 Chronic rhinitis: Secondary | ICD-10-CM | POA: Diagnosis not present

## 2017-11-20 DIAGNOSIS — M25561 Pain in right knee: Secondary | ICD-10-CM | POA: Diagnosis not present

## 2017-11-20 DIAGNOSIS — M1711 Unilateral primary osteoarthritis, right knee: Secondary | ICD-10-CM | POA: Diagnosis not present

## 2017-11-20 DIAGNOSIS — M545 Low back pain: Secondary | ICD-10-CM | POA: Diagnosis not present

## 2017-11-28 DIAGNOSIS — M2241 Chondromalacia patellae, right knee: Secondary | ICD-10-CM | POA: Diagnosis not present

## 2017-11-28 DIAGNOSIS — M25561 Pain in right knee: Secondary | ICD-10-CM | POA: Diagnosis not present

## 2017-12-05 DIAGNOSIS — M25561 Pain in right knee: Secondary | ICD-10-CM | POA: Diagnosis not present

## 2017-12-05 DIAGNOSIS — M1711 Unilateral primary osteoarthritis, right knee: Secondary | ICD-10-CM | POA: Diagnosis not present

## 2017-12-10 DIAGNOSIS — G4733 Obstructive sleep apnea (adult) (pediatric): Secondary | ICD-10-CM | POA: Diagnosis not present

## 2017-12-17 DIAGNOSIS — M25561 Pain in right knee: Secondary | ICD-10-CM | POA: Diagnosis not present

## 2017-12-17 DIAGNOSIS — M1711 Unilateral primary osteoarthritis, right knee: Secondary | ICD-10-CM | POA: Diagnosis not present

## 2017-12-23 DIAGNOSIS — G4733 Obstructive sleep apnea (adult) (pediatric): Secondary | ICD-10-CM | POA: Diagnosis not present

## 2018-01-01 DIAGNOSIS — M25561 Pain in right knee: Secondary | ICD-10-CM | POA: Diagnosis not present

## 2018-01-01 DIAGNOSIS — M1711 Unilateral primary osteoarthritis, right knee: Secondary | ICD-10-CM | POA: Diagnosis not present

## 2018-01-23 DIAGNOSIS — G4733 Obstructive sleep apnea (adult) (pediatric): Secondary | ICD-10-CM | POA: Diagnosis not present

## 2018-02-12 DIAGNOSIS — E785 Hyperlipidemia, unspecified: Secondary | ICD-10-CM | POA: Diagnosis not present

## 2018-02-12 DIAGNOSIS — Z6841 Body Mass Index (BMI) 40.0 and over, adult: Secondary | ICD-10-CM | POA: Diagnosis not present

## 2018-02-12 DIAGNOSIS — I10 Essential (primary) hypertension: Secondary | ICD-10-CM | POA: Diagnosis not present

## 2018-02-12 DIAGNOSIS — Z79899 Other long term (current) drug therapy: Secondary | ICD-10-CM | POA: Diagnosis not present

## 2018-02-12 DIAGNOSIS — Z1331 Encounter for screening for depression: Secondary | ICD-10-CM | POA: Diagnosis not present

## 2018-02-12 DIAGNOSIS — Z9181 History of falling: Secondary | ICD-10-CM | POA: Diagnosis not present

## 2018-02-12 DIAGNOSIS — F418 Other specified anxiety disorders: Secondary | ICD-10-CM | POA: Diagnosis not present

## 2018-02-12 DIAGNOSIS — Z1339 Encounter for screening examination for other mental health and behavioral disorders: Secondary | ICD-10-CM | POA: Diagnosis not present

## 2018-02-17 DIAGNOSIS — S60212A Contusion of left wrist, initial encounter: Secondary | ICD-10-CM | POA: Diagnosis not present

## 2018-02-18 DIAGNOSIS — S62111A Displaced fracture of triquetrum [cuneiform] bone, right wrist, initial encounter for closed fracture: Secondary | ICD-10-CM | POA: Diagnosis not present

## 2018-02-22 DIAGNOSIS — G4733 Obstructive sleep apnea (adult) (pediatric): Secondary | ICD-10-CM | POA: Diagnosis not present

## 2018-03-04 DIAGNOSIS — J310 Chronic rhinitis: Secondary | ICD-10-CM | POA: Diagnosis not present

## 2018-03-04 DIAGNOSIS — G4733 Obstructive sleep apnea (adult) (pediatric): Secondary | ICD-10-CM | POA: Diagnosis not present

## 2018-03-05 DIAGNOSIS — S62111D Displaced fracture of triquetrum [cuneiform] bone, right wrist, subsequent encounter for fracture with routine healing: Secondary | ICD-10-CM | POA: Diagnosis not present

## 2018-03-24 DIAGNOSIS — G4733 Obstructive sleep apnea (adult) (pediatric): Secondary | ICD-10-CM | POA: Diagnosis not present

## 2018-03-25 DIAGNOSIS — G4733 Obstructive sleep apnea (adult) (pediatric): Secondary | ICD-10-CM | POA: Diagnosis not present

## 2018-04-02 DIAGNOSIS — S62114D Nondisplaced fracture of triquetrum [cuneiform] bone, right wrist, subsequent encounter for fracture with routine healing: Secondary | ICD-10-CM | POA: Diagnosis not present

## 2018-04-15 IMAGING — DX DG CHEST 2V
2 series · 2 of 2 positions shown · non-contrast
Comparison: None.

CLINICAL DATA: Dyspnea on exertion.

EXAM:
CHEST  2 VIEW

[chest pa]
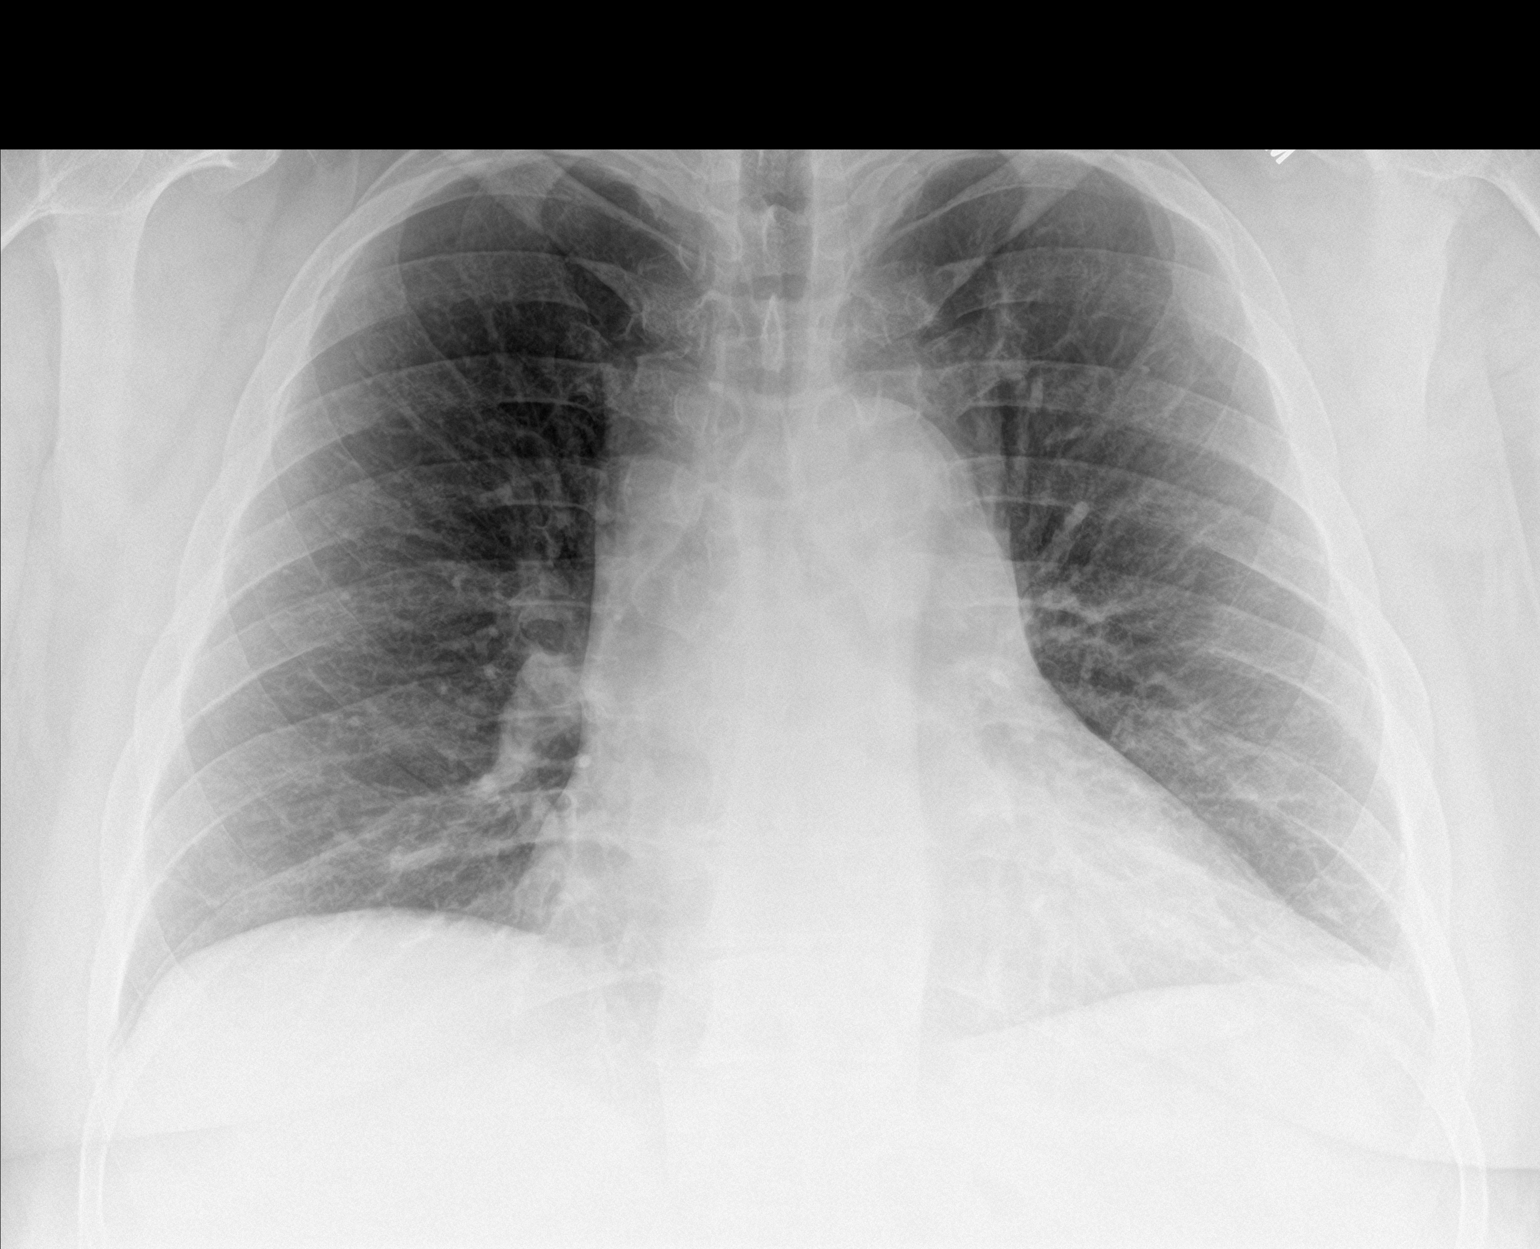

[chest lat]
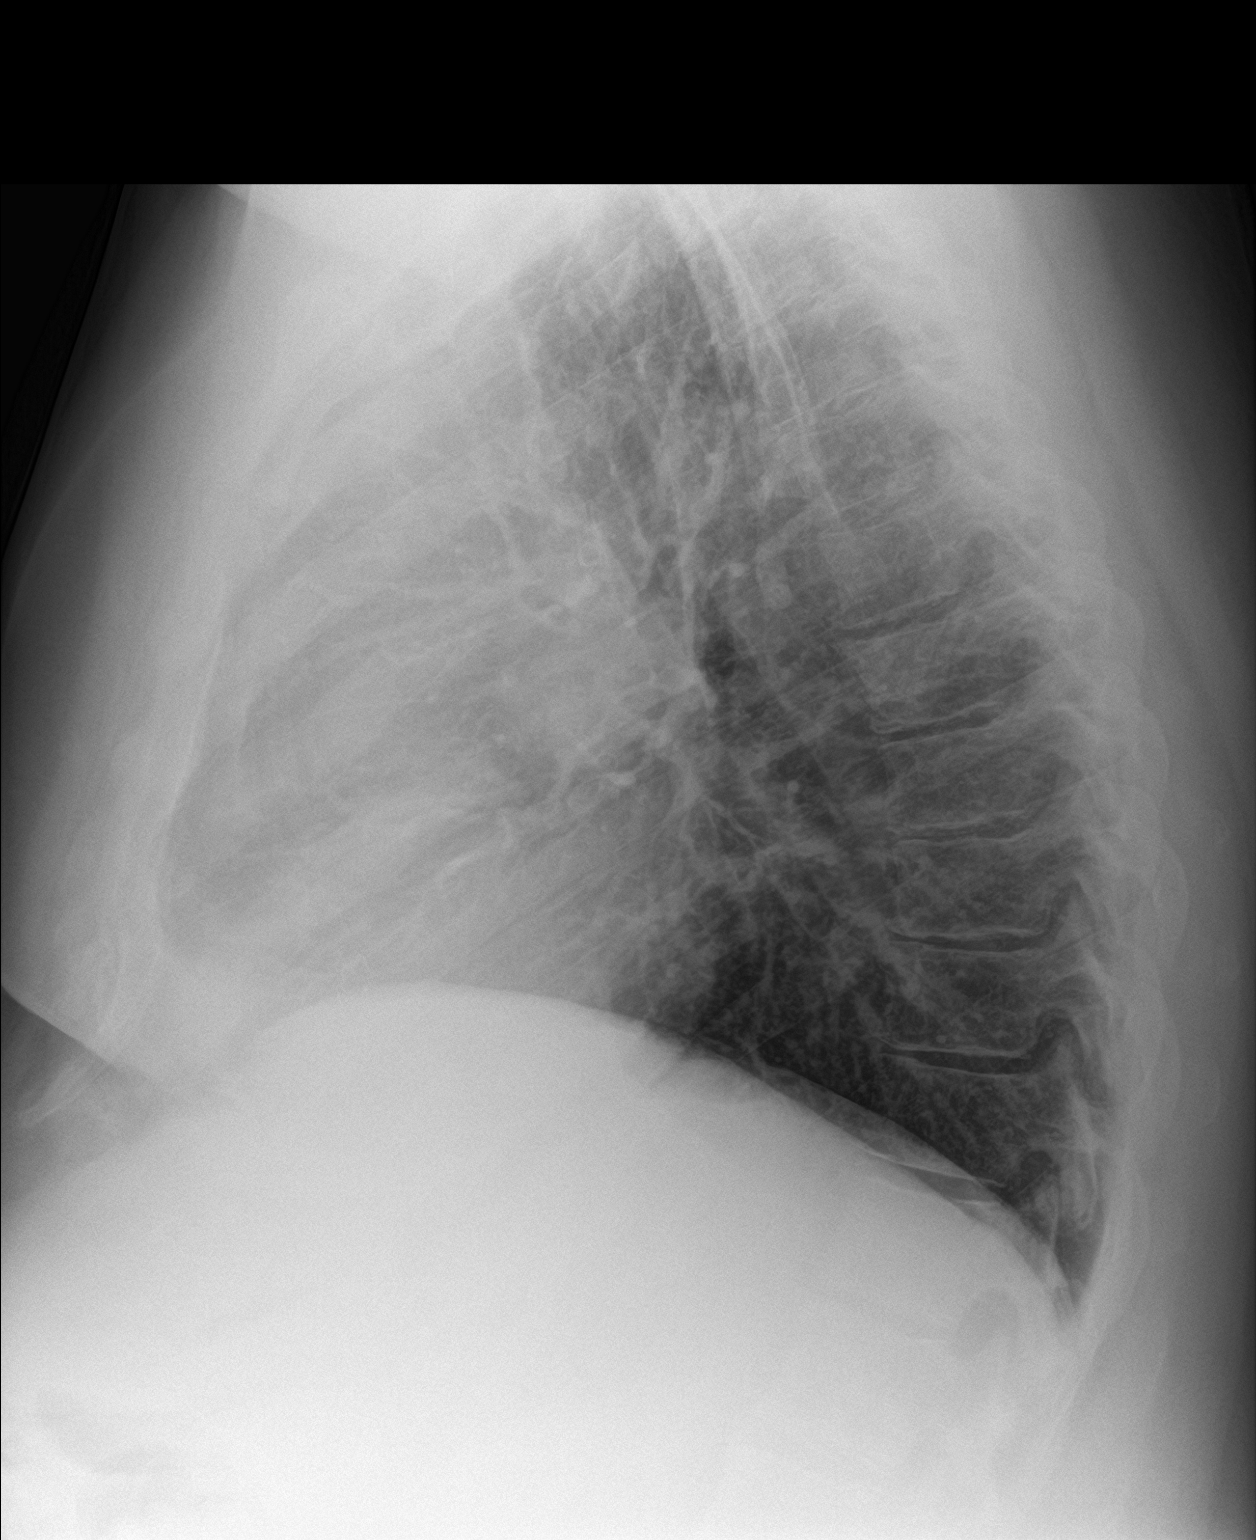

[2 of 2 positions shown; findings below may reference images not displayed]

FINDINGS: The heart size and mediastinal contours are within normal limits.
Both lungs are clear. No pneumothorax or pleural effusion is noted.
The visualized skeletal structures are unremarkable.
IMPRESSION: No active cardiopulmonary disease.

## 2018-04-25 DIAGNOSIS — G4733 Obstructive sleep apnea (adult) (pediatric): Secondary | ICD-10-CM | POA: Diagnosis not present

## 2018-05-05 DIAGNOSIS — S62114D Nondisplaced fracture of triquetrum [cuneiform] bone, right wrist, subsequent encounter for fracture with routine healing: Secondary | ICD-10-CM | POA: Diagnosis not present

## 2018-05-25 DIAGNOSIS — G4733 Obstructive sleep apnea (adult) (pediatric): Secondary | ICD-10-CM | POA: Diagnosis not present

## 2018-06-25 DIAGNOSIS — G4733 Obstructive sleep apnea (adult) (pediatric): Secondary | ICD-10-CM | POA: Diagnosis not present

## 2018-06-30 DIAGNOSIS — J069 Acute upper respiratory infection, unspecified: Secondary | ICD-10-CM | POA: Diagnosis not present

## 2018-07-06 DIAGNOSIS — J029 Acute pharyngitis, unspecified: Secondary | ICD-10-CM | POA: Diagnosis not present

## 2018-07-25 DIAGNOSIS — G4733 Obstructive sleep apnea (adult) (pediatric): Secondary | ICD-10-CM | POA: Diagnosis not present

## 2018-08-25 DIAGNOSIS — J310 Chronic rhinitis: Secondary | ICD-10-CM | POA: Diagnosis not present

## 2018-08-25 DIAGNOSIS — G4733 Obstructive sleep apnea (adult) (pediatric): Secondary | ICD-10-CM | POA: Diagnosis not present

## 2018-09-13 DIAGNOSIS — M545 Low back pain: Secondary | ICD-10-CM | POA: Diagnosis not present

## 2018-09-22 DIAGNOSIS — Z0000 Encounter for general adult medical examination without abnormal findings: Secondary | ICD-10-CM | POA: Diagnosis not present

## 2018-09-22 DIAGNOSIS — Z6841 Body Mass Index (BMI) 40.0 and over, adult: Secondary | ICD-10-CM | POA: Diagnosis not present

## 2018-09-22 DIAGNOSIS — M5416 Radiculopathy, lumbar region: Secondary | ICD-10-CM | POA: Diagnosis not present

## 2018-09-25 DIAGNOSIS — G4733 Obstructive sleep apnea (adult) (pediatric): Secondary | ICD-10-CM | POA: Diagnosis not present

## 2018-10-14 DIAGNOSIS — C44629 Squamous cell carcinoma of skin of left upper limb, including shoulder: Secondary | ICD-10-CM | POA: Diagnosis not present

## 2018-10-14 DIAGNOSIS — L821 Other seborrheic keratosis: Secondary | ICD-10-CM | POA: Diagnosis not present

## 2018-10-14 DIAGNOSIS — L578 Other skin changes due to chronic exposure to nonionizing radiation: Secondary | ICD-10-CM | POA: Diagnosis not present

## 2018-10-14 DIAGNOSIS — L820 Inflamed seborrheic keratosis: Secondary | ICD-10-CM | POA: Diagnosis not present

## 2018-11-19 DIAGNOSIS — M5416 Radiculopathy, lumbar region: Secondary | ICD-10-CM | POA: Diagnosis not present

## 2018-11-19 DIAGNOSIS — Z6841 Body Mass Index (BMI) 40.0 and over, adult: Secondary | ICD-10-CM | POA: Diagnosis not present

## 2019-02-15 DIAGNOSIS — Z6841 Body Mass Index (BMI) 40.0 and over, adult: Secondary | ICD-10-CM | POA: Diagnosis not present

## 2019-02-15 DIAGNOSIS — E785 Hyperlipidemia, unspecified: Secondary | ICD-10-CM | POA: Diagnosis not present

## 2019-02-15 DIAGNOSIS — Z9181 History of falling: Secondary | ICD-10-CM | POA: Diagnosis not present

## 2019-02-15 DIAGNOSIS — F418 Other specified anxiety disorders: Secondary | ICD-10-CM | POA: Diagnosis not present

## 2019-02-15 DIAGNOSIS — Z79899 Other long term (current) drug therapy: Secondary | ICD-10-CM | POA: Diagnosis not present

## 2019-02-15 DIAGNOSIS — I10 Essential (primary) hypertension: Secondary | ICD-10-CM | POA: Diagnosis not present

## 2019-02-23 DIAGNOSIS — G4733 Obstructive sleep apnea (adult) (pediatric): Secondary | ICD-10-CM | POA: Diagnosis not present

## 2019-03-01 ENCOUNTER — Other Ambulatory Visit: Payer: Self-pay

## 2019-06-18 DIAGNOSIS — J324 Chronic pansinusitis: Secondary | ICD-10-CM | POA: Diagnosis not present

## 2019-08-13 DIAGNOSIS — F418 Other specified anxiety disorders: Secondary | ICD-10-CM | POA: Diagnosis not present

## 2019-08-13 DIAGNOSIS — Z743 Need for continuous supervision: Secondary | ICD-10-CM | POA: Diagnosis not present

## 2019-08-13 DIAGNOSIS — M1990 Unspecified osteoarthritis, unspecified site: Secondary | ICD-10-CM | POA: Diagnosis not present

## 2019-08-13 DIAGNOSIS — R0789 Other chest pain: Secondary | ICD-10-CM | POA: Diagnosis not present

## 2019-08-13 DIAGNOSIS — G4733 Obstructive sleep apnea (adult) (pediatric): Secondary | ICD-10-CM | POA: Diagnosis not present

## 2019-08-13 DIAGNOSIS — R0602 Shortness of breath: Secondary | ICD-10-CM | POA: Diagnosis not present

## 2019-08-13 DIAGNOSIS — R079 Chest pain, unspecified: Secondary | ICD-10-CM | POA: Diagnosis not present

## 2019-08-13 DIAGNOSIS — R7989 Other specified abnormal findings of blood chemistry: Secondary | ICD-10-CM | POA: Diagnosis not present

## 2019-08-13 DIAGNOSIS — E7800 Pure hypercholesterolemia, unspecified: Secondary | ICD-10-CM | POA: Diagnosis not present

## 2019-08-13 DIAGNOSIS — I10 Essential (primary) hypertension: Secondary | ICD-10-CM | POA: Diagnosis not present

## 2019-08-13 DIAGNOSIS — Z79899 Other long term (current) drug therapy: Secondary | ICD-10-CM | POA: Diagnosis not present

## 2019-08-14 DIAGNOSIS — R079 Chest pain, unspecified: Secondary | ICD-10-CM | POA: Diagnosis not present

## 2019-08-15 DIAGNOSIS — R079 Chest pain, unspecified: Secondary | ICD-10-CM | POA: Diagnosis not present

## 2019-08-20 DIAGNOSIS — Z79899 Other long term (current) drug therapy: Secondary | ICD-10-CM | POA: Diagnosis not present

## 2019-08-20 DIAGNOSIS — F418 Other specified anxiety disorders: Secondary | ICD-10-CM | POA: Diagnosis not present

## 2019-08-20 DIAGNOSIS — Z6841 Body Mass Index (BMI) 40.0 and over, adult: Secondary | ICD-10-CM | POA: Diagnosis not present

## 2019-08-20 DIAGNOSIS — I10 Essential (primary) hypertension: Secondary | ICD-10-CM | POA: Diagnosis not present

## 2019-08-30 NOTE — Progress Notes (Deleted)
Cardiology Office Note:    Date:  08/30/2019   ID:  Kyle Fritz, DOB June 06, 1951, MRN [id number]  PCP:  Kyle Hipps, MD  Cardiologist:  Kyle More, MD    Referring MD: Kyle Hipps, MD    ASSESSMENT:    No diagnosis found. PLAN:    In order of problems listed above:  1. ***   Next appointment: ***   Medication Adjustments/Labs and Tests Ordered: Current medicines are reviewed at length with the patient today.  Concerns regarding medicines are outlined above.  No orders of the defined types were placed in this encounter.  No orders of the defined types were placed in this encounter.   No chief complaint on file.   History of Present Illness:    Kyle Fritz is a 69 y.o. male with a hx of *** last seen ***. Compliance with diet, lifestyle and medications: *** Past Medical History:  Diagnosis Date  . Hyperlipemia   . Hypertension   . OSA (obstructive sleep apnea)     *** The histories are not reviewed yet. Please review them in the "History" navigator section and refresh this [place].  Current Medications: No outpatient medications have been marked as taking for the 08/31/19 encounter (Appointment) with Kyle Priest, MD.     Allergies:   Patient has no known allergies.   Social History   Socioeconomic History  . Marital status: Married    Spouse name: Not on file  . Number of children: Not on file  . Years of education: Not on file  . Highest education level: Not on file  Occupational History  . Not on file  Tobacco Use  . Smoking status: Former Smoker    Packs/day: 2.00    Years: 20.00    Pack years: 40.00    Types: Cigarettes    Quit date: 07/29/1980    Years since quitting: 39.1  . Smokeless tobacco: Never Used  Substance and Sexual Activity  . Alcohol use: Not on file  . Drug use: Not on file  . Sexual activity: Not on file  Other Topics Concern  . Not on file  Social History Narrative  . Not on file   Social Determinants of  Health   Financial Resource Strain:   . Difficulty of Paying Living Expenses: Not on file  Food Insecurity:   . Worried About Charity fundraiser in the Last Year: Not on file  . Ran Out of Food in the Last Year: Not on file  Transportation Needs:   . Lack of Transportation (Medical): Not on file  . Lack of Transportation (Non-Medical): Not on file  Physical Activity:   . Days of Exercise per Week: Not on file  . Minutes of Exercise per Session: Not on file  Stress:   . Feeling of Stress : Not on file  Social Connections:   . Frequency of Communication with Friends and Family: Not on file  . Frequency of Social Gatherings with Friends and Family: Not on file  . Attends Religious Services: Not on file  . Active Member of Clubs or Organizations: Not on file  . Attends Archivist Meetings: Not on file  . Marital Status: Not on file     Family History: The patient's ***family history includes Breast cancer in his mother; Melanoma in his mother. ROS:   Please see the history of present illness.    All other systems reviewed and are negative.  EKGs/Labs/Other Studies Reviewed:  The following studies were reviewed today:  EKG:  EKG ordered today and personally reviewed.  The ekg ordered today demonstrates ***  Recent Labs: No results found for requested labs within last 8760 hours.  Recent Lipid Panel No results found for: CHOL, TRIG, HDL, CHOLHDL, VLDL, LDLCALC, LDLDIRECT  Physical Exam:    VS:  There were no vitals taken for this visit.    Wt Readings from Last 3 Encounters:  11/25/16 (!) 351 lb (159.2 kg)  10/03/16 (!) 361 lb (163.7 kg)  09/04/16 (!) 367 lb (166.5 kg)     GEN: *** Well nourished, well developed in no acute distress HEENT: Normal NECK: No JVD; No carotid bruits LYMPHATICS: No lymphadenopathy CARDIAC: ***RRR, no murmurs, rubs, gallops RESPIRATORY:  Clear to auscultation without rales, wheezing or rhonchi  ABDOMEN: Soft, non-tender,  non-distended MUSCULOSKELETAL:  No edema; No deformity  SKIN: Warm and dry NEUROLOGIC:  Alert and oriented x 3 PSYCHIATRIC:  Normal affect    Signed, Kyle More, MD  08/30/2019 12:57 PM    [facility]

## 2019-08-30 NOTE — Progress Notes (Signed)
Cardiology Office Note:    Date:  08/31/2019   ID:  [name], DOB [birth year], MRN [id number]  PCP:  Ronita Hipps, MD  Cardiologist:  Shirlee More, MD   Referring MD: Ronita Hipps, MD  ASSESSMENT:    1. Chest pain of uncertain etiology   2. Hyperlipidemia, unspecified hyperlipidemia type   3. Obstructive sleep apnea   4. Thyroid nodule    PLAN:    In order of problems listed above:  1. Pain was atypical nonanginal perfusion study was normal he has no coronary calcification at this time I would not perform coronary angiography.  The patient and his wife are reassured. 2. Continue a statin 3. Continue CPAP 4. Thyroid nodule duplex ordered he will follow up with Dr. Helene Kelp  Next appointment as needed   Medication Adjustments/Labs and Tests Ordered: Current medicines are reviewed at length with the patient today.  Concerns regarding medicines are outlined above.  No orders of the defined types were placed in this encounter.  No orders of the defined types were placed in this encounter.    Chief Complaint  Patient presents with  . Follow-up    Discuss stress test    History of Present Illness:    Kyle Fritz is a 69 y.o. male who is being seen today for the evaluation of chest pain at the request of Ronita Hipps, MD.  He was admitted to [facility] 08/13/2019 0 08/05/2019 with chest pain.  EKG was normal serial troponins were undetectable myocardial perfusion study with Lexiscan was normal.  His D-dimer is mildly elevated he underwent a CTA of the chest which showed no coronary artery calcification he did have an adrenal nodule which was a chronic finding left thyroid nodule 2.4 cm heterogeneous with recommended thyroid ultrasound.  Does not appear as if it was done during the hospitalization. It was it recommended discharge to see cardiology in follow-up there was no communication to me I was the position of the hospital and was seen by Dr. Haroldine Laws in 2011  for shortness of breath that he felt was related to obesity and associated restrictive lung disease.  Unfortunately left the  hospital an understanding of his myocardial perfusion study that was normal.  The symptoms preceding admission were very atypical nonanginal point localized chest pain waxed and waned unrelated to physical effort.  Known history of heart disease since discharge his medications were altered for anxiety and depression he has had no recurrent chest discomfort.  He was unaware that he had a thyroid nodule and at his request I will order an ultrasound a follow-up with his primary care physician.  I spoke with his wife by phone and reassured her that his perfusion study was normal and at this time I do not think I perform invasive procedures like coronary angiography.  He does have stable exertional shortness of breath due to lung disease and sleep apnea.  He has a history of previous orthopedic surgery related thrombophlebitis CTA of the chest showed no evidence of pulmonary embolism Past Medical History:  Diagnosis Date  . Hyperlipemia   . Hypertension   . OSA (obstructive sleep apnea)     Past Surgical History:  Procedure Laterality Date  . CHOLECYSTECTOMY      Current Medications: Current Meds  Medication Sig  . atorvastatin (LIPITOR) 40 MG tablet Take 40 mg by mouth daily.  . cyclobenzaprine (FLEXERIL) 10 MG tablet Take 10 mg by mouth 2 (two) times daily.  Marland Kitchen desvenlafaxine (  PRISTIQ) 100 MG 24 hr tablet Take 100 mg by mouth daily.     Allergies:   Patient has no known allergies.   Social History   Socioeconomic History  . Marital status: Married    Spouse name: Not on file  . Number of children: Not on file  . Years of education: Not on file  . Highest education level: Not on file  Occupational History  . Not on file  Tobacco Use  . Smoking status: Former Smoker    Packs/day: 2.00    Years: 20.00    Pack years: 40.00    Types: Cigarettes    Quit date:  07/29/1980    Years since quitting: 39.1  . Smokeless tobacco: Never Used  Substance and Sexual Activity  . Alcohol use: Never  . Drug use: Never  . Sexual activity: Not on file  Other Topics Concern  . Not on file  Social History Narrative  . Not on file   Social Determinants of Health   Financial Resource Strain:   . Difficulty of Paying Living Expenses: Not on file  Food Insecurity:   . Worried About Charity fundraiser in the Last Year: Not on file  . Ran Out of Food in the Last Year: Not on file  Transportation Needs:   . Lack of Transportation (Medical): Not on file  . Lack of Transportation (Non-Medical): Not on file  Physical Activity:   . Days of Exercise per Week: Not on file  . Minutes of Exercise per Session: Not on file  Stress:   . Feeling of Stress : Not on file  Social Connections:   . Frequency of Communication with Friends and Family: Not on file  . Frequency of Social Gatherings with Friends and Family: Not on file  . Attends Religious Services: Not on file  . Active Member of Clubs or Organizations: Not on file  . Attends Archivist Meetings: Not on file  . Marital Status: Not on file     Family History: The patient's family history includes Breast cancer in his mother; Melanoma in his mother.  ROS:   Review of Systems  Constitution: Negative.  HENT: Negative.   Eyes: Negative.   Cardiovascular: Positive for chest pain.  Respiratory: Positive for shortness of breath and snoring.   Endocrine: Negative.   Hematologic/Lymphatic: Negative.   Skin: Negative.   Musculoskeletal: Positive for joint pain.  Gastrointestinal: Negative.   Genitourinary: Negative.   Neurological: Negative.   Psychiatric/Behavioral: Positive for depression. The patient is nervous/anxious.   Allergic/Immunologic: Negative.    Please see the history of present illness.     All other systems reviewed and are negative.  EKGs/Labs/Other Studies Reviewed:    The  following studies were reviewed today:   EKG: I did not repeat an EKG today  Recent Labs: 08/13/19: Troponin 3 all undetectable Hgb 12.5 A1 c 6.1% D Dimer elevated 702 CMP norma;  Physical Exam:    VS:  BP 140/90 (BP Location: Right Arm, Patient Position: Sitting)   Pulse 67   Ht 6' 2.5" (1.892 m)   Wt (!) 358 lb (162.4 kg)   SpO2 94%   BMI 45.35 kg/m     Wt Readings from Last 3 Encounters:  08/31/19 (!) 358 lb (162.4 kg)  11/25/16 (!) 351 lb (159.2 kg)  10/03/16 (!) 361 lb (163.7 kg)     GEN: Marked obesity BMI exceeds 45 well nourished, well developed in no acute distress HEENT:  Normal NECK: No JVD; No carotid bruits LYMPHATICS: No lymphadenopathy CARDIAC: RRR, no murmurs, rubs, gallops RESPIRATORY:  Clear to auscultation without rales, wheezing or rhonchi  ABDOMEN: Soft, non-tender, non-distended MUSCULOSKELETAL:  No edema; No deformity  SKIN: Warm and dry NEUROLOGIC:  Alert and oriented x 3 PSYCHIATRIC:  Normal affect     Signed, Shirlee More, MD  08/31/2019 4:24 PM    [place]

## 2019-08-31 ENCOUNTER — Ambulatory Visit (INDEPENDENT_AMBULATORY_CARE_PROVIDER_SITE_OTHER): Payer: PPO | Admitting: Cardiology

## 2019-08-31 ENCOUNTER — Other Ambulatory Visit: Payer: Self-pay

## 2019-08-31 ENCOUNTER — Encounter: Payer: Self-pay | Admitting: Cardiology

## 2019-08-31 VITALS — BP 140/90 | HR 67 | Ht 74.5 in | Wt 358.0 lb

## 2019-08-31 DIAGNOSIS — E041 Nontoxic single thyroid nodule: Secondary | ICD-10-CM | POA: Diagnosis not present

## 2019-08-31 DIAGNOSIS — R079 Chest pain, unspecified: Secondary | ICD-10-CM | POA: Diagnosis not present

## 2019-08-31 DIAGNOSIS — R911 Solitary pulmonary nodule: Secondary | ICD-10-CM | POA: Insufficient documentation

## 2019-08-31 DIAGNOSIS — G4733 Obstructive sleep apnea (adult) (pediatric): Secondary | ICD-10-CM

## 2019-08-31 DIAGNOSIS — E785 Hyperlipidemia, unspecified: Secondary | ICD-10-CM | POA: Diagnosis not present

## 2019-08-31 NOTE — Patient Instructions (Signed)
Medication Instructions:  Your physician recommends that you continue on your current medications as directed. Please refer to the Current Medication list given to you today.  *If you need a refill on your cardiac medications before your next appointment, please call your pharmacy*  Lab Work: Your physician recommends that you have a Bmp drawn 7 days prior to your CT If you have labs (blood work) drawn today and your tests are completely normal, you will receive your results only by: Marland Kitchen MyChart Message (if you have MyChart) OR . A paper copy in the mail If you have any lab test that is abnormal or we need to change your treatment, we will call you to review the results.  Testing/Procedures: Your physician recommends that you have a Thyroid US and CT of chest performed.   Follow-Up: At [facility], you and your health needs are our priority.  As part of our continuing mission to provide you with exceptional heart care, we have created designated Provider Care Teams.  These Care Teams include your primary Cardiologist (physician) and Advanced Practice Providers (APPs -  Physician Assistants and Nurse Practitioners) who all work together to provide you with the care you need, when you need it.  Your next appointment:   AS needed in the future   The format for your next appointment:   In Person  Provider:   Shirlee More, MD

## 2019-09-07 DIAGNOSIS — E041 Nontoxic single thyroid nodule: Secondary | ICD-10-CM | POA: Diagnosis not present

## 2019-09-07 DIAGNOSIS — G4733 Obstructive sleep apnea (adult) (pediatric): Secondary | ICD-10-CM | POA: Diagnosis not present

## 2019-09-07 DIAGNOSIS — R911 Solitary pulmonary nodule: Secondary | ICD-10-CM | POA: Diagnosis not present

## 2019-09-16 ENCOUNTER — Telehealth: Payer: Self-pay | Admitting: Emergency Medicine

## 2019-09-16 DIAGNOSIS — R9389 Abnormal findings on diagnostic imaging of other specified body structures: Secondary | ICD-10-CM

## 2019-09-16 NOTE — Telephone Encounter (Signed)
Called patient informed him of ct and thyroid results per Dr. Bettina Gavia. Referred him to endocrinology per Dr. Bettina Gavia.

## 2019-10-08 ENCOUNTER — Other Ambulatory Visit: Payer: Self-pay

## 2019-10-08 ENCOUNTER — Ambulatory Visit: Payer: PPO | Admitting: Endocrinology

## 2019-10-08 ENCOUNTER — Encounter: Payer: Self-pay | Admitting: Endocrinology

## 2019-10-08 VITALS — BP 122/74 | HR 84 | Ht 74.5 in | Wt 360.2 lb

## 2019-10-08 DIAGNOSIS — E041 Nontoxic single thyroid nodule: Secondary | ICD-10-CM | POA: Diagnosis not present

## 2019-10-08 NOTE — Progress Notes (Signed)
Subjective:    Patient ID: Kyle Fritz, male    DOB: 1950-09-09, 69 y.o.   MRN: [id number]  HPI Pt is referred by Dr Helene Kelp, for nodular thyroid.  Pt was noted to have a nodule at the thyroid in early 2021.  He is unaware of ever having had thyroid problems in the past.  He has no h/o XRT or surgery to the neck.   Past Medical History:  Diagnosis Date  . Hyperlipemia   . Hypertension   . OSA (obstructive sleep apnea)     Past Surgical History:  Procedure Laterality Date  . CHOLECYSTECTOMY      Social History   Socioeconomic History  . Marital status: Married    Spouse name: Not on file  . Number of children: Not on file  . Years of education: Not on file  . Highest education level: Not on file  Occupational History  . Not on file  Tobacco Use  . Smoking status: Former Smoker    Packs/day: 2.00    Years: 20.00    Pack years: 40.00    Types: Cigarettes    Quit date: 07/29/1980    Years since quitting: 39.2  . Smokeless tobacco: Never Used  Substance and Sexual Activity  . Alcohol use: Never  . Drug use: Never  . Sexual activity: Not on file  Other Topics Concern  . Not on file  Social History Narrative  . Not on file   Social Determinants of Health   Financial Resource Strain:   . Difficulty of Paying Living Expenses:   Food Insecurity:   . Worried About Charity fundraiser in the Last Year:   . Arboriculturist in the Last Year:   Transportation Needs:   . Film/video editor (Medical):   Marland Kitchen Lack of Transportation (Non-Medical):   Physical Activity:   . Days of Exercise per Week:   . Minutes of Exercise per Session:   Stress:   . Feeling of Stress :   Social Connections:   . Frequency of Communication with Friends and Family:   . Frequency of Social Gatherings with Friends and Family:   . Attends Religious Services:   . Active Member of Clubs or Organizations:   . Attends Archivist Meetings:   Marland Kitchen Marital Status:   Intimate Partner  Violence:   . Fear of Current or Ex-Partner:   . Emotionally Abused:   Marland Kitchen Physically Abused:   . Sexually Abused:     Current Outpatient Medications on File Prior to Visit  Medication Sig Dispense Refill  . aspirin EC 81 MG tablet Take 81 mg by mouth daily.    Marland Kitchen atenolol (TENORMIN) 100 MG tablet Take 100 mg by mouth daily.    Marland Kitchen atorvastatin (LIPITOR) 40 MG tablet Take 40 mg by mouth daily.    . clonazePAM (KLONOPIN) 0.5 MG tablet Take 0.5 mg by mouth 2 (two) times daily.    . cyclobenzaprine (FLEXERIL) 10 MG tablet Take 10 mg by mouth 2 (two) times daily.    Marland Kitchen desvenlafaxine (PRISTIQ) 100 MG 24 hr tablet Take 100 mg by mouth daily.    Marland Kitchen UNABLE TO FIND Med Name: CPAP     No current facility-administered medications on file prior to visit.    No Known Allergies  Family History  Problem Relation Age of Onset  . Melanoma Mother   . Breast cancer Mother   . Thyroid disease Neg Hx  BP 122/74 (BP Location: Right Arm, Patient Position: Sitting, Cuff Size: Large)   Pulse 84   Ht 6' 2.5" (1.892 m)   Wt (!) 360 lb 3.2 oz (163.4 kg)   SpO2 93%   BMI 45.63 kg/m    Review of Systems Denies weight change, hoarseness, neck pain, sob, cough, diarrhea, flushing, and cold intolerance.  He has mild solid dysphagia.       Objective:   Physical Exam VS: see vs page GEN: no distress HEAD: head: no deformity eyes: no periorbital swelling, no proptosis external nose and ears are normal NECK: supple, thyroid is not enlarged.  I can't feel the nodule.   CHEST WALL: no deformity LUNGS: clear to auscultation CV: reg rate and rhythm, no murmur.  MUSCULOSKELETAL: muscle bulk and strength are grossly normal.  no obvious joint swelling.  gait is normal and steady.  EXTEMITIES: no deformity.  1+ bilat leg edema.   PULSES: no carotid bruit NEURO:  cn 2-12 grossly intact.   readily moves all 4's.  sensation is intact to touch on all 4's.  SKIN:  Normal texture and temperature.  No rash or  suspicious lesion is visible.   NODES:  None palpable at the neck PSYCH: alert, well-oriented.  Does not appear anxious nor depressed.     Lab Results  Component Value Date   TSH 1.94 10/03/2016   [place]: LLP thyroid nodule, 2.5 cm  I have reviewed outside records, and summarized: Pt was noted to have thyroid nodule, and referred here.  He had CT for unrelated reason, and thyr nod was incidentally noted    Assessment & Plan:  Thyroid nodule, new to me.  Non-palpable, so [place] will have to be guided by [place].    Patient Instructions  The swallowing difficulty is not related to the thyroid.  You should ask your primary care provider about this. Blood tests are requested for you today.  We'll let you know about the results.  Let's check the biopsy, guided by ultrasound.  you will receive a phone call, about a day and time for an appointment.   If no cancer is found, Please come back for a follow-up appointment in 6 months.

## 2019-10-08 NOTE — Patient Instructions (Addendum)
The swallowing difficulty is not related to the thyroid.  You should ask your primary care provider about this. Blood tests are requested for you today.  We'll let you know about the results.  Let's check the biopsy, guided by ultrasound.  you will receive a phone call, about a day and time for an appointment.   If no cancer is found, Please come back for a follow-up appointment in 6 months.

## 2019-10-14 DIAGNOSIS — L578 Other skin changes due to chronic exposure to nonionizing radiation: Secondary | ICD-10-CM | POA: Diagnosis not present

## 2019-10-14 DIAGNOSIS — D2372 Other benign neoplasm of skin of left lower limb, including hip: Secondary | ICD-10-CM | POA: Diagnosis not present

## 2019-10-14 DIAGNOSIS — L821 Other seborrheic keratosis: Secondary | ICD-10-CM | POA: Diagnosis not present

## 2020-02-09 DIAGNOSIS — M1711 Unilateral primary osteoarthritis, right knee: Secondary | ICD-10-CM | POA: Diagnosis not present

## 2020-02-09 DIAGNOSIS — M25561 Pain in right knee: Secondary | ICD-10-CM | POA: Diagnosis not present

## 2020-02-10 DIAGNOSIS — Z6841 Body Mass Index (BMI) 40.0 and over, adult: Secondary | ICD-10-CM | POA: Diagnosis not present

## 2020-02-10 DIAGNOSIS — Z1331 Encounter for screening for depression: Secondary | ICD-10-CM | POA: Diagnosis not present

## 2020-02-10 DIAGNOSIS — I10 Essential (primary) hypertension: Secondary | ICD-10-CM | POA: Diagnosis not present

## 2020-02-10 DIAGNOSIS — E7800 Pure hypercholesterolemia, unspecified: Secondary | ICD-10-CM | POA: Diagnosis not present

## 2020-02-10 DIAGNOSIS — Z79899 Other long term (current) drug therapy: Secondary | ICD-10-CM | POA: Diagnosis not present

## 2020-02-10 DIAGNOSIS — F418 Other specified anxiety disorders: Secondary | ICD-10-CM | POA: Diagnosis not present

## 2020-02-10 DIAGNOSIS — Z0000 Encounter for general adult medical examination without abnormal findings: Secondary | ICD-10-CM | POA: Diagnosis not present

## 2020-03-14 DIAGNOSIS — E668 Other obesity: Secondary | ICD-10-CM | POA: Diagnosis not present

## 2020-03-14 DIAGNOSIS — G4733 Obstructive sleep apnea (adult) (pediatric): Secondary | ICD-10-CM | POA: Diagnosis not present

## 2020-03-27 DIAGNOSIS — R05 Cough: Secondary | ICD-10-CM | POA: Diagnosis not present

## 2020-03-27 DIAGNOSIS — J80 Acute respiratory distress syndrome: Secondary | ICD-10-CM | POA: Diagnosis not present

## 2020-03-27 DIAGNOSIS — R0602 Shortness of breath: Secondary | ICD-10-CM | POA: Diagnosis not present

## 2020-03-27 DIAGNOSIS — R091 Pleurisy: Secondary | ICD-10-CM | POA: Diagnosis not present

## 2020-03-27 DIAGNOSIS — J9811 Atelectasis: Secondary | ICD-10-CM | POA: Diagnosis not present

## 2020-03-27 DIAGNOSIS — E278 Other specified disorders of adrenal gland: Secondary | ICD-10-CM | POA: Diagnosis not present

## 2020-04-06 DIAGNOSIS — Z6841 Body Mass Index (BMI) 40.0 and over, adult: Secondary | ICD-10-CM | POA: Diagnosis not present

## 2020-04-06 DIAGNOSIS — R091 Pleurisy: Secondary | ICD-10-CM | POA: Diagnosis not present

## 2020-04-13 ENCOUNTER — Ambulatory Visit: Payer: PPO | Admitting: Endocrinology

## 2020-05-16 DIAGNOSIS — U071 COVID-19: Secondary | ICD-10-CM | POA: Diagnosis not present

## 2020-05-16 DIAGNOSIS — J1282 Pneumonia due to coronavirus disease 2019: Secondary | ICD-10-CM | POA: Diagnosis not present

## 2020-05-16 DIAGNOSIS — R5381 Other malaise: Secondary | ICD-10-CM | POA: Diagnosis not present

## 2020-05-16 DIAGNOSIS — I10 Essential (primary) hypertension: Secondary | ICD-10-CM | POA: Diagnosis not present

## 2020-05-16 DIAGNOSIS — M1990 Unspecified osteoarthritis, unspecified site: Secondary | ICD-10-CM | POA: Diagnosis not present

## 2020-05-16 DIAGNOSIS — R079 Chest pain, unspecified: Secondary | ICD-10-CM | POA: Diagnosis not present

## 2020-05-16 DIAGNOSIS — F32A Depression, unspecified: Secondary | ICD-10-CM | POA: Diagnosis not present

## 2020-05-16 DIAGNOSIS — Z7982 Long term (current) use of aspirin: Secondary | ICD-10-CM | POA: Diagnosis not present

## 2020-05-16 DIAGNOSIS — R0602 Shortness of breath: Secondary | ICD-10-CM | POA: Diagnosis not present

## 2020-05-16 DIAGNOSIS — R0902 Hypoxemia: Secondary | ICD-10-CM | POA: Diagnosis not present

## 2020-05-16 DIAGNOSIS — Z79899 Other long term (current) drug therapy: Secondary | ICD-10-CM | POA: Diagnosis not present

## 2020-05-16 DIAGNOSIS — E7800 Pure hypercholesterolemia, unspecified: Secondary | ICD-10-CM | POA: Diagnosis not present

## 2020-05-16 DIAGNOSIS — R918 Other nonspecific abnormal finding of lung field: Secondary | ICD-10-CM | POA: Diagnosis not present

## 2020-05-16 DIAGNOSIS — E782 Mixed hyperlipidemia: Secondary | ICD-10-CM | POA: Diagnosis not present

## 2020-05-16 DIAGNOSIS — R7989 Other specified abnormal findings of blood chemistry: Secondary | ICD-10-CM | POA: Diagnosis not present

## 2020-05-16 DIAGNOSIS — Z8701 Personal history of pneumonia (recurrent): Secondary | ICD-10-CM | POA: Diagnosis not present

## 2020-05-16 DIAGNOSIS — I517 Cardiomegaly: Secondary | ICD-10-CM | POA: Diagnosis not present

## 2020-05-16 DIAGNOSIS — F419 Anxiety disorder, unspecified: Secondary | ICD-10-CM | POA: Diagnosis not present

## 2020-05-16 DIAGNOSIS — Z6841 Body Mass Index (BMI) 40.0 and over, adult: Secondary | ICD-10-CM | POA: Diagnosis not present

## 2020-05-16 DIAGNOSIS — J9601 Acute respiratory failure with hypoxia: Secondary | ICD-10-CM | POA: Diagnosis not present

## 2020-05-16 DIAGNOSIS — K760 Fatty (change of) liver, not elsewhere classified: Secondary | ICD-10-CM | POA: Diagnosis not present

## 2020-05-16 DIAGNOSIS — E041 Nontoxic single thyroid nodule: Secondary | ICD-10-CM | POA: Diagnosis not present

## 2020-05-20 DIAGNOSIS — U071 COVID-19: Secondary | ICD-10-CM | POA: Diagnosis not present

## 2020-05-26 DIAGNOSIS — U071 COVID-19: Secondary | ICD-10-CM

## 2020-06-13 DIAGNOSIS — Z9981 Dependence on supplemental oxygen: Secondary | ICD-10-CM | POA: Diagnosis not present

## 2020-06-13 DIAGNOSIS — J9611 Chronic respiratory failure with hypoxia: Secondary | ICD-10-CM | POA: Diagnosis not present

## 2020-06-13 DIAGNOSIS — Z6841 Body Mass Index (BMI) 40.0 and over, adult: Secondary | ICD-10-CM | POA: Diagnosis not present

## 2020-07-07 DIAGNOSIS — Z6841 Body Mass Index (BMI) 40.0 and over, adult: Secondary | ICD-10-CM | POA: Diagnosis not present

## 2020-07-07 DIAGNOSIS — J40 Bronchitis, not specified as acute or chronic: Secondary | ICD-10-CM | POA: Diagnosis not present

## 2020-09-07 DIAGNOSIS — J189 Pneumonia, unspecified organism: Secondary | ICD-10-CM | POA: Diagnosis not present

## 2020-09-07 DIAGNOSIS — J449 Chronic obstructive pulmonary disease, unspecified: Secondary | ICD-10-CM | POA: Diagnosis not present

## 2020-09-07 DIAGNOSIS — R0781 Pleurodynia: Secondary | ICD-10-CM | POA: Diagnosis not present

## 2020-09-07 DIAGNOSIS — R0789 Other chest pain: Secondary | ICD-10-CM | POA: Diagnosis not present

## 2020-09-07 DIAGNOSIS — J1282 Pneumonia due to coronavirus disease 2019: Secondary | ICD-10-CM | POA: Diagnosis not present

## 2020-09-07 DIAGNOSIS — R071 Chest pain on breathing: Secondary | ICD-10-CM | POA: Diagnosis not present

## 2020-09-07 DIAGNOSIS — E041 Nontoxic single thyroid nodule: Secondary | ICD-10-CM | POA: Diagnosis not present

## 2020-09-13 DIAGNOSIS — Z8616 Personal history of COVID-19: Secondary | ICD-10-CM | POA: Diagnosis not present

## 2020-09-13 DIAGNOSIS — E668 Other obesity: Secondary | ICD-10-CM | POA: Diagnosis not present

## 2020-09-13 DIAGNOSIS — G4733 Obstructive sleep apnea (adult) (pediatric): Secondary | ICD-10-CM | POA: Diagnosis not present

## 2020-10-16 DIAGNOSIS — L280 Lichen simplex chronicus: Secondary | ICD-10-CM | POA: Diagnosis not present

## 2020-10-16 DIAGNOSIS — L821 Other seborrheic keratosis: Secondary | ICD-10-CM | POA: Diagnosis not present

## 2020-10-16 DIAGNOSIS — L578 Other skin changes due to chronic exposure to nonionizing radiation: Secondary | ICD-10-CM | POA: Diagnosis not present

## 2020-10-16 DIAGNOSIS — L820 Inflamed seborrheic keratosis: Secondary | ICD-10-CM | POA: Diagnosis not present

## 2021-02-07 DIAGNOSIS — G4733 Obstructive sleep apnea (adult) (pediatric): Secondary | ICD-10-CM | POA: Diagnosis not present

## 2021-02-07 DIAGNOSIS — I10 Essential (primary) hypertension: Secondary | ICD-10-CM | POA: Diagnosis not present

## 2021-02-07 DIAGNOSIS — Z6841 Body Mass Index (BMI) 40.0 and over, adult: Secondary | ICD-10-CM | POA: Diagnosis not present

## 2021-02-28 DIAGNOSIS — G4733 Obstructive sleep apnea (adult) (pediatric): Secondary | ICD-10-CM | POA: Diagnosis not present

## 2021-03-06 DIAGNOSIS — R059 Cough, unspecified: Secondary | ICD-10-CM | POA: Diagnosis not present

## 2021-03-06 DIAGNOSIS — G4733 Obstructive sleep apnea (adult) (pediatric): Secondary | ICD-10-CM | POA: Diagnosis not present

## 2021-03-06 DIAGNOSIS — J449 Chronic obstructive pulmonary disease, unspecified: Secondary | ICD-10-CM | POA: Diagnosis not present

## 2021-03-06 DIAGNOSIS — Z8616 Personal history of COVID-19: Secondary | ICD-10-CM | POA: Diagnosis not present

## 2021-03-06 DIAGNOSIS — E668 Other obesity: Secondary | ICD-10-CM | POA: Diagnosis not present

## 2021-03-22 DIAGNOSIS — R109 Unspecified abdominal pain: Secondary | ICD-10-CM | POA: Diagnosis not present

## 2021-04-03 DIAGNOSIS — G4733 Obstructive sleep apnea (adult) (pediatric): Secondary | ICD-10-CM | POA: Diagnosis not present

## 2021-04-06 DIAGNOSIS — K449 Diaphragmatic hernia without obstruction or gangrene: Secondary | ICD-10-CM | POA: Diagnosis not present

## 2021-04-06 DIAGNOSIS — R0602 Shortness of breath: Secondary | ICD-10-CM | POA: Diagnosis not present

## 2021-04-06 DIAGNOSIS — E668 Other obesity: Secondary | ICD-10-CM | POA: Diagnosis not present

## 2021-04-06 DIAGNOSIS — Z8616 Personal history of COVID-19: Secondary | ICD-10-CM | POA: Diagnosis not present

## 2021-04-06 DIAGNOSIS — R071 Chest pain on breathing: Secondary | ICD-10-CM | POA: Diagnosis not present

## 2021-04-06 DIAGNOSIS — R079 Chest pain, unspecified: Secondary | ICD-10-CM | POA: Diagnosis not present

## 2021-04-06 DIAGNOSIS — I2699 Other pulmonary embolism without acute cor pulmonale: Secondary | ICD-10-CM | POA: Diagnosis not present

## 2021-04-06 DIAGNOSIS — J449 Chronic obstructive pulmonary disease, unspecified: Secondary | ICD-10-CM | POA: Diagnosis not present

## 2021-04-06 DIAGNOSIS — G4733 Obstructive sleep apnea (adult) (pediatric): Secondary | ICD-10-CM | POA: Diagnosis not present

## 2021-04-18 DIAGNOSIS — G4733 Obstructive sleep apnea (adult) (pediatric): Secondary | ICD-10-CM | POA: Diagnosis not present

## 2021-04-18 DIAGNOSIS — E668 Other obesity: Secondary | ICD-10-CM | POA: Diagnosis not present

## 2021-04-18 DIAGNOSIS — Z8616 Personal history of COVID-19: Secondary | ICD-10-CM | POA: Diagnosis not present

## 2021-05-06 DIAGNOSIS — J449 Chronic obstructive pulmonary disease, unspecified: Secondary | ICD-10-CM | POA: Diagnosis not present

## 2021-06-06 DIAGNOSIS — J449 Chronic obstructive pulmonary disease, unspecified: Secondary | ICD-10-CM | POA: Diagnosis not present

## 2021-07-06 DIAGNOSIS — J449 Chronic obstructive pulmonary disease, unspecified: Secondary | ICD-10-CM | POA: Diagnosis not present

## 2021-08-06 DIAGNOSIS — Z8616 Personal history of COVID-19: Secondary | ICD-10-CM | POA: Diagnosis not present

## 2021-08-06 DIAGNOSIS — J449 Chronic obstructive pulmonary disease, unspecified: Secondary | ICD-10-CM | POA: Diagnosis not present

## 2021-08-06 DIAGNOSIS — E668 Other obesity: Secondary | ICD-10-CM | POA: Diagnosis not present

## 2021-08-06 DIAGNOSIS — G4733 Obstructive sleep apnea (adult) (pediatric): Secondary | ICD-10-CM | POA: Diagnosis not present

## 2021-08-06 DIAGNOSIS — R0600 Dyspnea, unspecified: Secondary | ICD-10-CM | POA: Diagnosis not present

## 2021-08-13 DIAGNOSIS — R0600 Dyspnea, unspecified: Secondary | ICD-10-CM | POA: Diagnosis not present

## 2021-09-06 DIAGNOSIS — J449 Chronic obstructive pulmonary disease, unspecified: Secondary | ICD-10-CM | POA: Diagnosis not present

## 2021-09-10 DIAGNOSIS — E668 Other obesity: Secondary | ICD-10-CM | POA: Diagnosis not present

## 2021-09-10 DIAGNOSIS — Z8616 Personal history of COVID-19: Secondary | ICD-10-CM | POA: Diagnosis not present

## 2021-09-10 DIAGNOSIS — R0600 Dyspnea, unspecified: Secondary | ICD-10-CM | POA: Diagnosis not present

## 2021-09-10 DIAGNOSIS — G4733 Obstructive sleep apnea (adult) (pediatric): Secondary | ICD-10-CM | POA: Diagnosis not present

## 2021-09-18 DIAGNOSIS — H5203 Hypermetropia, bilateral: Secondary | ICD-10-CM | POA: Diagnosis not present

## 2021-10-01 DIAGNOSIS — M549 Dorsalgia, unspecified: Secondary | ICD-10-CM | POA: Diagnosis not present

## 2021-10-01 DIAGNOSIS — R0981 Nasal congestion: Secondary | ICD-10-CM | POA: Diagnosis not present

## 2021-10-01 DIAGNOSIS — J069 Acute upper respiratory infection, unspecified: Secondary | ICD-10-CM | POA: Diagnosis not present

## 2021-10-01 DIAGNOSIS — Z20828 Contact with and (suspected) exposure to other viral communicable diseases: Secondary | ICD-10-CM | POA: Diagnosis not present

## 2021-10-04 DIAGNOSIS — J449 Chronic obstructive pulmonary disease, unspecified: Secondary | ICD-10-CM | POA: Diagnosis not present

## 2021-10-16 DIAGNOSIS — R0600 Dyspnea, unspecified: Secondary | ICD-10-CM | POA: Diagnosis not present

## 2021-10-16 DIAGNOSIS — R5382 Chronic fatigue, unspecified: Secondary | ICD-10-CM | POA: Diagnosis not present

## 2021-10-16 DIAGNOSIS — R0602 Shortness of breath: Secondary | ICD-10-CM | POA: Diagnosis not present

## 2021-10-18 DIAGNOSIS — L280 Lichen simplex chronicus: Secondary | ICD-10-CM | POA: Diagnosis not present

## 2021-10-18 DIAGNOSIS — L821 Other seborrheic keratosis: Secondary | ICD-10-CM | POA: Diagnosis not present

## 2021-10-18 DIAGNOSIS — L578 Other skin changes due to chronic exposure to nonionizing radiation: Secondary | ICD-10-CM | POA: Diagnosis not present

## 2021-10-18 DIAGNOSIS — L820 Inflamed seborrheic keratosis: Secondary | ICD-10-CM | POA: Diagnosis not present

## 2021-11-04 DIAGNOSIS — J449 Chronic obstructive pulmonary disease, unspecified: Secondary | ICD-10-CM | POA: Diagnosis not present

## 2021-11-13 DIAGNOSIS — H25811 Combined forms of age-related cataract, right eye: Secondary | ICD-10-CM | POA: Diagnosis not present

## 2021-12-04 DIAGNOSIS — J449 Chronic obstructive pulmonary disease, unspecified: Secondary | ICD-10-CM | POA: Diagnosis not present

## 2021-12-06 DIAGNOSIS — M545 Low back pain, unspecified: Secondary | ICD-10-CM | POA: Diagnosis not present

## 2021-12-07 DIAGNOSIS — H2511 Age-related nuclear cataract, right eye: Secondary | ICD-10-CM | POA: Diagnosis not present

## 2021-12-07 DIAGNOSIS — H25811 Combined forms of age-related cataract, right eye: Secondary | ICD-10-CM | POA: Diagnosis not present

## 2021-12-19 DIAGNOSIS — H2512 Age-related nuclear cataract, left eye: Secondary | ICD-10-CM | POA: Diagnosis not present

## 2021-12-19 DIAGNOSIS — H25812 Combined forms of age-related cataract, left eye: Secondary | ICD-10-CM | POA: Diagnosis not present

## 2022-01-04 DIAGNOSIS — J449 Chronic obstructive pulmonary disease, unspecified: Secondary | ICD-10-CM | POA: Diagnosis not present

## 2022-01-28 DIAGNOSIS — Z6841 Body Mass Index (BMI) 40.0 and over, adult: Secondary | ICD-10-CM | POA: Diagnosis not present

## 2022-01-28 DIAGNOSIS — Z125 Encounter for screening for malignant neoplasm of prostate: Secondary | ICD-10-CM | POA: Diagnosis not present

## 2022-01-28 DIAGNOSIS — Z0000 Encounter for general adult medical examination without abnormal findings: Secondary | ICD-10-CM | POA: Diagnosis not present

## 2022-02-03 DIAGNOSIS — J449 Chronic obstructive pulmonary disease, unspecified: Secondary | ICD-10-CM | POA: Diagnosis not present

## 2022-05-06 DIAGNOSIS — J449 Chronic obstructive pulmonary disease, unspecified: Secondary | ICD-10-CM | POA: Diagnosis not present

## 2022-06-06 DIAGNOSIS — J449 Chronic obstructive pulmonary disease, unspecified: Secondary | ICD-10-CM | POA: Diagnosis not present

## 2022-07-06 DIAGNOSIS — J449 Chronic obstructive pulmonary disease, unspecified: Secondary | ICD-10-CM | POA: Diagnosis not present

## 2022-08-06 DIAGNOSIS — J449 Chronic obstructive pulmonary disease, unspecified: Secondary | ICD-10-CM | POA: Diagnosis not present

## 2022-09-06 DIAGNOSIS — J449 Chronic obstructive pulmonary disease, unspecified: Secondary | ICD-10-CM | POA: Diagnosis not present

## 2022-10-03 DIAGNOSIS — Z1331 Encounter for screening for depression: Secondary | ICD-10-CM | POA: Diagnosis not present

## 2022-10-03 DIAGNOSIS — Z0000 Encounter for general adult medical examination without abnormal findings: Secondary | ICD-10-CM | POA: Diagnosis not present

## 2022-10-03 DIAGNOSIS — E7800 Pure hypercholesterolemia, unspecified: Secondary | ICD-10-CM | POA: Diagnosis not present

## 2022-10-03 DIAGNOSIS — R413 Other amnesia: Secondary | ICD-10-CM | POA: Diagnosis not present

## 2022-10-03 DIAGNOSIS — Z6841 Body Mass Index (BMI) 40.0 and over, adult: Secondary | ICD-10-CM | POA: Diagnosis not present

## 2022-10-03 DIAGNOSIS — R7309 Other abnormal glucose: Secondary | ICD-10-CM | POA: Diagnosis not present

## 2022-10-05 DIAGNOSIS — J449 Chronic obstructive pulmonary disease, unspecified: Secondary | ICD-10-CM | POA: Diagnosis not present

## 2022-10-10 DIAGNOSIS — R110 Nausea: Secondary | ICD-10-CM | POA: Diagnosis not present

## 2022-10-10 DIAGNOSIS — R42 Dizziness and giddiness: Secondary | ICD-10-CM | POA: Diagnosis not present

## 2022-10-10 DIAGNOSIS — R059 Cough, unspecified: Secondary | ICD-10-CM | POA: Diagnosis not present

## 2022-10-10 DIAGNOSIS — R519 Headache, unspecified: Secondary | ICD-10-CM | POA: Diagnosis not present

## 2022-10-21 DIAGNOSIS — L821 Other seborrheic keratosis: Secondary | ICD-10-CM | POA: Diagnosis not present

## 2022-10-21 DIAGNOSIS — L578 Other skin changes due to chronic exposure to nonionizing radiation: Secondary | ICD-10-CM | POA: Diagnosis not present

## 2022-11-05 DIAGNOSIS — J449 Chronic obstructive pulmonary disease, unspecified: Secondary | ICD-10-CM | POA: Diagnosis not present

## 2022-11-26 ENCOUNTER — Encounter: Payer: Self-pay | Admitting: Diagnostic Neuroimaging

## 2022-11-26 ENCOUNTER — Ambulatory Visit: Payer: Medicare Other | Admitting: Diagnostic Neuroimaging

## 2022-11-26 ENCOUNTER — Telehealth: Payer: Self-pay | Admitting: Diagnostic Neuroimaging

## 2022-11-26 VITALS — BP 129/76 | HR 75 | Ht 75.0 in | Wt 319.0 lb

## 2022-11-26 DIAGNOSIS — R413 Other amnesia: Secondary | ICD-10-CM

## 2022-11-26 NOTE — Progress Notes (Signed)
[facility]  PATIENT: Kyle Fritz DOB: [birth year]  REFERRING CLINICIAN: Marylen Ponto, MD HISTORY FROM: patient and wife REASON FOR VISIT: new consult   HISTORICAL  CHIEF COMPLAINT:  Chief Complaint  Patient presents with   New Patient (Initial Visit)    Patient in room #6 with his wife. Patient states he has some memory loss.    HISTORY OF PRESENT ILLNESS:   72 year old male here for evaluation of memory impairment.  Patient had COVID in December 2020.  Since that time he started to have some short-term memory problems, brain fog and confusion.  This has been mild, gradual and progressive.  Having some more difficulties managing some ADLs at home such as taking medications, cooking meals, shopping.  Family history of dementia in mother and brother.   REVIEW OF SYSTEMS: Full 14 system review of systems performed and negative with exception of: as per hPI.  ALLERGIES: No Known Allergies  HOME MEDICATIONS: Outpatient Medications Prior to Visit  Medication Sig Dispense Refill   aspirin EC 81 MG tablet Take 81 mg by mouth daily.     atenolol (TENORMIN) 100 MG tablet Take 100 mg by mouth daily.     atorvastatin (LIPITOR) 40 MG tablet Take 40 mg by mouth daily.     clonazePAM (KLONOPIN) 0.5 MG tablet Take 0.5 mg by mouth 2 (two) times daily.     desvenlafaxine (PRISTIQ) 100 MG 24 hr tablet Take 100 mg by mouth daily.     donepezil (ARICEPT) 5 MG tablet Take 5 mg by mouth daily.     UNABLE TO FIND Med Name: CPAP     albuterol (VENTOLIN HFA) 108 (90 Base) MCG/ACT inhaler Inhale 2 puffs into the lungs every 4 (four) hours as needed for wheezing or shortness of breath. (Patient not taking: Reported on 11/26/2022)     budesonide-formoterol (SYMBICORT) 160-4.5 MCG/ACT inhaler Inhale 2 puffs into the lungs 2 (two) times daily. (Patient not taking: Reported on 11/26/2022)     cetirizine (ZYRTEC) 10 MG tablet Take 10 mg by mouth daily. (Patient not taking: Reported on  11/26/2022)     cyclobenzaprine (FLEXERIL) 10 MG tablet Take 10 mg by mouth 2 (two) times daily. (Patient not taking: Reported on 11/26/2022)     Melatonin 1 MG CAPS Take 1 mg by mouth at bedtime. (Patient not taking: Reported on 11/26/2022)     No facility-administered medications prior to visit.    PAST MEDICAL HISTORY: Past Medical History:  Diagnosis Date   Hyperlipemia    Hypertension    OSA (obstructive sleep apnea)     PAST SURGICAL HISTORY: Past Surgical History:  Procedure Laterality Date   CHOLECYSTECTOMY      FAMILY HISTORY: Family History  Problem Relation Age of Onset   Melanoma Mother    Breast cancer Mother    Thyroid disease Neg Hx     SOCIAL HISTORY: Social History   Socioeconomic History   Marital status: Married    Spouse name: Not on file   Number of children: Not on file   Years of education: Not on file   Highest education level: Not on file  Occupational History   Not on file  Tobacco Use   Smoking status: Former    Packs/day: 2.00    Years: 20.00    Additional pack years: 0.00    Total pack years: 40.00    Types: Cigarettes    Quit date: 07/29/1980    Years since quitting: 42.3  Smokeless tobacco: Never  Vaping Use   Vaping Use: Never used  Substance and Sexual Activity   Alcohol use: Never   Drug use: Never   Sexual activity: Not on file  Other Topics Concern   Not on file  Social History Narrative   Not on file   Social Determinants of Health   Financial Resource Strain: Not on file  Food Insecurity: Not on file  Transportation Needs: Not on file  Physical Activity: Not on file  Stress: Not on file  Social Connections: Not on file  Intimate Partner Violence: Not on file     PHYSICAL EXAM  GENERAL EXAM/CONSTITUTIONAL: Vitals:  Vitals:   11/26/22 1109  BP: 129/76  Pulse: 75  Weight: (!) 319 lb (144.7 kg)  Height: 6\' 3"  (1.905 m)   Body mass index is 39.87 kg/m. Wt Readings from Last 3 Encounters:  11/26/22 (!)  319 lb (144.7 kg)  10/08/19 (!) 360 lb 3.2 oz (163.4 kg)  08/31/19 (!) 358 lb (162.4 kg)   Patient is in no distress; well developed, nourished and groomed; neck is supple  CARDIOVASCULAR: Examination of carotid arteries is normal; no carotid bruits Regular rate and rhythm, no murmurs Examination of peripheral vascular system by observation and palpation is normal  EYES: Ophthalmoscopic exam of optic discs and posterior segments is normal; no papilledema or hemorrhages No results found.  MUSCULOSKELETAL: Gait, strength, tone, movements noted in Neurologic exam below  NEUROLOGIC: MENTAL STATUS:     11/26/2022   11:10 AM  MMSE - Mini Mental State Exam  Orientation to time 5  Orientation to Place 5  Registration 3  Attention/ Calculation 2  Recall 0  Language- name 2 objects 2  Language- repeat 1  Language- follow 3 step command 3  Language- read & follow direction 1  Write a sentence 1  Copy design 1  Total score 24   awake, alert, oriented to person, place and time recent and remote memory intact normal attention and concentration language fluent, comprehension intact, naming intact fund of knowledge appropriate QUIET; WITHDRAWN  CRANIAL NERVE:  2nd - no papilledema on fundoscopic exam 2nd, 3rd, 4th, 6th - pupils equal and reactive to light, visual fields full to confrontation, extraocular muscles intact, no nystagmus 5th - facial sensation symmetric 7th - facial strength symmetric 8th - hearing intact 9th - palate elevates symmetrically, uvula midline 11th - shoulder shrug symmetric 12th - tongue protrusion midline  MOTOR:  normal bulk and tone, full strength in the BUE, BLE  SENSORY:  normal and symmetric to light touch, temperature, vibration  COORDINATION:  finger-nose-finger, fine finger movements normal  REFLEXES:  deep tendon reflexes TRACE and symmetric  GAIT/STATION:  narrow based gait     DIAGNOSTIC DATA (LABS, IMAGING, TESTING) - I  reviewed patient records, labs, notes, testing and imaging myself where available.  Lab Results  Component Value Date   WBC 8.6 10/03/2016   HGB 14.4 10/03/2016   HCT 42.2 10/03/2016   MCV 85.8 10/03/2016   PLT 206.0 10/03/2016      Component Value Date/Time   NA 140 10/03/2016 0947   K 4.9 10/03/2016 0947   CL 104 10/03/2016 0947   CO2 29 10/03/2016 0947   GLUCOSE 119 (H) 10/03/2016 0947   BUN 21 10/03/2016 0947   CREATININE 0.99 10/03/2016 0947   CALCIUM 10.1 10/03/2016 0947   No results found for: "CHOL", "HDL", "LDLCALC", "LDLDIRECT", "TRIG", "CHOLHDL" No results found for: "HGBA1C" No results found for: "VITAMINB12"  Lab Results  Component Value Date   TSH 1.94 10/03/2016       ASSESSMENT AND PLAN  72 y.o. year old male here with:   Dx:  1. Memory loss     PLAN:  MEMORY LOSS (since ~2020 / 2021; ddx: MCI vs mild dementia vs other causes) - check MRI brain, ATN panel - consider memantine 10mg  at bedtime; increase to twice a day after 1-2 weeks - continue donepezil - safety / supervision issues reviewed - daily physical activity / exercise (at least 15-30 minutes) - eat more plants / vegetables - increase social activities, brain stimulation, games, puzzles, hobbies, crafts, arts, music - aim for at least 7-8 hours sleep per night (or more) - avoid smoking and alcohol - caregiver resources provided ([email or web address]) - caution with medications, finances, driving  Orders Placed This Encounter  Procedures   MR BRAIN W WO CONTRAST   ATN PROFILE   Return for pending test results, pending if symptoms worsen or fail to improve.    Suanne Marker, MD 11/26/2022, 11:51 AM Certified in Neurology, Neurophysiology and Neuroimaging  [facility] [address], [address] [place], [place] [address] [phone]

## 2022-11-26 NOTE — Patient Instructions (Signed)
MEMORY LOSS (ddx: MCI vs mild dementia vs other causes) - check MRI brain, ATN panel - consider memantine 10mg  at bedtime; increase to twice a day after 1-2 weeks - continue donepezil - safety / supervision issues reviewed - daily physical activity / exercise (at least 15-30 minutes) - eat more plants / vegetables - increase social activities, brain stimulation, games, puzzles, hobbies, crafts, arts, music - aim for at least 7-8 hours sleep per night (or more) - avoid smoking and alcohol - caregiver resources provided - caution with medications, finances, driving

## 2022-11-26 NOTE — Telephone Encounter (Signed)
[employer] medicare Berkley Harvey: [id number] exp. 11/26/22-12/25/22, HTA NPR sent to GI [phone]

## 2022-11-28 LAB — ATN PROFILE

## 2022-11-29 LAB — ATN PROFILE: A -- Beta-amyloid 42/40 Ratio: 0.095 — ABNORMAL LOW (ref >0.102)

## 2022-12-05 DIAGNOSIS — J449 Chronic obstructive pulmonary disease, unspecified: Secondary | ICD-10-CM | POA: Diagnosis not present

## 2023-01-04 ENCOUNTER — Other Ambulatory Visit: Payer: PPO

## 2023-01-05 DIAGNOSIS — J449 Chronic obstructive pulmonary disease, unspecified: Secondary | ICD-10-CM | POA: Diagnosis not present

## 2023-02-04 DIAGNOSIS — J449 Chronic obstructive pulmonary disease, unspecified: Secondary | ICD-10-CM | POA: Diagnosis not present

## 2023-02-27 ENCOUNTER — Other Ambulatory Visit: Payer: PPO

## 2023-03-05 ENCOUNTER — Encounter: Payer: Self-pay | Admitting: Diagnostic Neuroimaging

## 2023-03-07 ENCOUNTER — Ambulatory Visit
Admission: RE | Admit: 2023-03-07 | Discharge: 2023-03-07 | Disposition: A | Discharge status: Home / Self Care | Payer: Medicare Other | Source: Ambulatory Visit | Attending: Diagnostic Neuroimaging | Admitting: Diagnostic Neuroimaging

## 2023-03-07 DIAGNOSIS — R413 Other amnesia: Secondary | ICD-10-CM | POA: Diagnosis not present

## 2023-03-07 DIAGNOSIS — J449 Chronic obstructive pulmonary disease, unspecified: Secondary | ICD-10-CM | POA: Diagnosis not present

## 2023-03-07 MED ORDER — GADOPICLENOL 0.5 MMOL/ML IV SOLN
10.0000 mL | Freq: Once | INTRAVENOUS | Status: AC | PRN
  Administered 2023-03-07: 10 mL via INTRAVENOUS

## 2023-03-11 ENCOUNTER — Telehealth: Payer: Self-pay | Admitting: Anesthesiology

## 2023-03-11 NOTE — Telephone Encounter (Signed)
-----   Message from [facility] sent at 03/11/2023  4:59 PM EDT ----- Unremarkable imaging results. Please call patient. Continue current plan. -VRP

## 2023-03-13 MED ORDER — MEMANTINE HCL 10 MG PO TABS: 10.0000 mg | ORAL_TABLET | Freq: Every day | ORAL | 2 refills | Status: DC

## 2023-03-13 NOTE — Addendum Note (Signed)
Addended by: Berna Spare A on: 03/13/2023 07:11 AM   Modules accepted: Orders

## 2023-03-19 ENCOUNTER — Other Ambulatory Visit: Payer: Self-pay | Admitting: Anesthesiology

## 2023-03-19 MED ORDER — MEMANTINE HCL 10 MG PO TABS: 10.0000 mg | ORAL_TABLET | Freq: Every day | ORAL | 2 refills | Status: DC

## 2023-04-07 DIAGNOSIS — J449 Chronic obstructive pulmonary disease, unspecified: Secondary | ICD-10-CM | POA: Diagnosis not present

## 2023-05-07 DIAGNOSIS — J449 Chronic obstructive pulmonary disease, unspecified: Secondary | ICD-10-CM | POA: Diagnosis not present

## 2023-06-07 DIAGNOSIS — J449 Chronic obstructive pulmonary disease, unspecified: Secondary | ICD-10-CM | POA: Diagnosis not present

## 2023-06-20 ENCOUNTER — Other Ambulatory Visit: Payer: Self-pay | Admitting: Diagnostic Neuroimaging

## 2023-06-23 NOTE — Telephone Encounter (Signed)
Rx refilled per last office visit note.

## 2023-07-07 DIAGNOSIS — J449 Chronic obstructive pulmonary disease, unspecified: Secondary | ICD-10-CM | POA: Diagnosis not present

## 2023-08-07 DIAGNOSIS — J449 Chronic obstructive pulmonary disease, unspecified: Secondary | ICD-10-CM | POA: Diagnosis not present

## 2023-09-07 DIAGNOSIS — J449 Chronic obstructive pulmonary disease, unspecified: Secondary | ICD-10-CM | POA: Diagnosis not present

## 2023-10-05 DIAGNOSIS — J449 Chronic obstructive pulmonary disease, unspecified: Secondary | ICD-10-CM | POA: Diagnosis not present

## 2023-10-07 DIAGNOSIS — Z0000 Encounter for general adult medical examination without abnormal findings: Secondary | ICD-10-CM | POA: Diagnosis not present

## 2023-10-07 DIAGNOSIS — Z79899 Other long term (current) drug therapy: Secondary | ICD-10-CM | POA: Diagnosis not present

## 2023-10-07 DIAGNOSIS — Z6841 Body Mass Index (BMI) 40.0 and over, adult: Secondary | ICD-10-CM | POA: Diagnosis not present

## 2023-10-07 DIAGNOSIS — E291 Testicular hypofunction: Secondary | ICD-10-CM | POA: Diagnosis not present

## 2023-10-07 DIAGNOSIS — I10 Essential (primary) hypertension: Secondary | ICD-10-CM | POA: Diagnosis not present

## 2023-10-07 DIAGNOSIS — R7309 Other abnormal glucose: Secondary | ICD-10-CM | POA: Diagnosis not present

## 2023-10-07 DIAGNOSIS — E7800 Pure hypercholesterolemia, unspecified: Secondary | ICD-10-CM | POA: Diagnosis not present

## 2023-10-11 ENCOUNTER — Other Ambulatory Visit: Payer: Self-pay | Admitting: Diagnostic Neuroimaging

## 2023-10-13 NOTE — Telephone Encounter (Addendum)
 Last seen on 11/26/22 per note " consider memantine 10mg  at bedtime; increase to twice a day after 1-2 weeks "  No follow up scheduled    See 03/11/23 telephone encounter regarding increased dose.

## 2023-10-14 MED ORDER — MEMANTINE HCL 10 MG PO TABS: 10.0000 mg | ORAL_TABLET | Freq: Two times a day (BID) | ORAL | 2 refills | Status: DC

## 2023-10-14 NOTE — Telephone Encounter (Signed)
 Called and spoke to patient wife and she reports pt never increased to 1 po twice weekly after 1-2 weeks. She asked if I could send in new Rx with those directions to a new pharmacy. Rx sent to [facility] in [place] 

## 2023-12-05 DIAGNOSIS — J449 Chronic obstructive pulmonary disease, unspecified: Secondary | ICD-10-CM | POA: Diagnosis not present

## 2023-12-18 DIAGNOSIS — Z20822 Contact with and (suspected) exposure to covid-19: Secondary | ICD-10-CM | POA: Diagnosis not present

## 2023-12-18 DIAGNOSIS — R062 Wheezing: Secondary | ICD-10-CM | POA: Diagnosis not present

## 2023-12-18 DIAGNOSIS — R0602 Shortness of breath: Secondary | ICD-10-CM | POA: Diagnosis not present

## 2023-12-18 DIAGNOSIS — R0981 Nasal congestion: Secondary | ICD-10-CM | POA: Diagnosis not present

## 2023-12-18 DIAGNOSIS — R059 Cough, unspecified: Secondary | ICD-10-CM | POA: Diagnosis not present

## 2023-12-19 DIAGNOSIS — I444 Left anterior fascicular block: Secondary | ICD-10-CM | POA: Diagnosis not present

## 2023-12-23 DIAGNOSIS — J40 Bronchitis, not specified as acute or chronic: Secondary | ICD-10-CM | POA: Diagnosis not present

## 2023-12-23 DIAGNOSIS — Z6841 Body Mass Index (BMI) 40.0 and over, adult: Secondary | ICD-10-CM | POA: Diagnosis not present

## 2024-01-05 DIAGNOSIS — J449 Chronic obstructive pulmonary disease, unspecified: Secondary | ICD-10-CM | POA: Diagnosis not present

## 2024-01-09 ENCOUNTER — Other Ambulatory Visit: Payer: Self-pay | Admitting: Diagnostic Neuroimaging

## 2024-02-04 DIAGNOSIS — J449 Chronic obstructive pulmonary disease, unspecified: Secondary | ICD-10-CM | POA: Diagnosis not present

## 2024-02-24 ENCOUNTER — Other Ambulatory Visit: Payer: Self-pay | Admitting: Diagnostic Neuroimaging

## 2024-03-12 DIAGNOSIS — Z9981 Dependence on supplemental oxygen: Secondary | ICD-10-CM | POA: Diagnosis not present

## 2024-03-12 DIAGNOSIS — F3341 Major depressive disorder, recurrent, in partial remission: Secondary | ICD-10-CM | POA: Diagnosis not present

## 2024-03-23 ENCOUNTER — Other Ambulatory Visit: Payer: Self-pay | Admitting: Neurology

## 2024-03-23 DIAGNOSIS — R1310 Dysphagia, unspecified: Secondary | ICD-10-CM | POA: Diagnosis not present

## 2024-03-23 DIAGNOSIS — Z6841 Body Mass Index (BMI) 40.0 and over, adult: Secondary | ICD-10-CM | POA: Diagnosis not present

## 2024-03-23 DIAGNOSIS — R29810 Facial weakness: Secondary | ICD-10-CM | POA: Diagnosis not present

## 2024-03-30 ENCOUNTER — Other Ambulatory Visit (HOSPITAL_COMMUNITY): Payer: Self-pay | Admitting: Family Medicine

## 2024-03-30 DIAGNOSIS — R29810 Facial weakness: Secondary | ICD-10-CM

## 2024-04-05 ENCOUNTER — Ambulatory Visit (HOSPITAL_COMMUNITY)
Admission: RE | Admit: 2024-04-05 | Discharge: 2024-04-05 | Disposition: A | Discharge status: Home / Self Care | Source: Ambulatory Visit | Attending: Family Medicine | Admitting: Family Medicine

## 2024-04-05 DIAGNOSIS — R930 Abnormal findings on diagnostic imaging of skull and head, not elsewhere classified: Secondary | ICD-10-CM | POA: Insufficient documentation

## 2024-04-05 DIAGNOSIS — R29810 Facial weakness: Secondary | ICD-10-CM | POA: Insufficient documentation

## 2024-05-11 ENCOUNTER — Ambulatory Visit: Admitting: Diagnostic Neuroimaging

## 2024-05-11 ENCOUNTER — Encounter: Payer: Self-pay | Admitting: Diagnostic Neuroimaging

## 2024-05-11 VITALS — BP 134/69 | HR 61 | Ht 76.0 in | Wt 351.8 lb

## 2024-05-11 DIAGNOSIS — G301 Alzheimer's disease with late onset: Secondary | ICD-10-CM | POA: Diagnosis not present

## 2024-05-11 DIAGNOSIS — R1310 Dysphagia, unspecified: Secondary | ICD-10-CM | POA: Diagnosis not present

## 2024-05-11 DIAGNOSIS — F02B0 Dementia in other diseases classified elsewhere, moderate, without behavioral disturbance, psychotic disturbance, mood disturbance, and anxiety: Secondary | ICD-10-CM | POA: Diagnosis not present

## 2024-05-11 DIAGNOSIS — K117 Disturbances of salivary secretion: Secondary | ICD-10-CM | POA: Diagnosis not present

## 2024-05-11 NOTE — Progress Notes (Signed)
 [facility]  PATIENT: Kyle Fritz DOB: 1951-06-30  REFERRING CLINICIAN: Ina Marcellus RAMAN, MD HISTORY FROM: patient and wife REASON FOR VISIT: new consult   HISTORICAL  CHIEF COMPLAINT:  Chief Complaint  Patient presents with   RM 7     Patient is here with wife  for Mouth drop due to facial weakness -  feel like he has to wipe his mouth constantly - 2 months     HISTORY OF PRESENT ILLNESS:   UPDATE (05/11/24, VRP): Since last visit, here for eval of drooling and right facial droop, since Aug 2025. Also with mild dysphagia. Sxs are slightly improved. MRI MRA head were unremarkable.  Memory loss has continued.   PRIOR HPI ([id number]): 73 year old male here for evaluation of memory impairment.  Patient had COVID in December 2020.  Since that time he started to have some short-term memory problems, brain fog and confusion.  This has been mild, gradual and progressive.  Having some more difficulties managing some ADLs at home such as taking medications, cooking meals, shopping.  Family history of dementia in mother and brother.   REVIEW OF SYSTEMS: Full 14 system review of systems performed and negative with exception of: as per hPI.  ALLERGIES: No Known Allergies  HOME MEDICATIONS: Outpatient Medications Prior to Visit  Medication Sig Dispense Refill   ARIPiprazole (ABILIFY) 5 MG tablet Take 5 mg by mouth daily.     aspirin EC 81 MG tablet Take 81 mg by mouth daily. (Patient taking differently: Take 81 mg by mouth daily. As needed)     atenolol (TENORMIN) 100 MG tablet Take 100 mg by mouth daily.     atorvastatin (LIPITOR) 40 MG tablet Take 40 mg by mouth daily.     Cholecalciferol (VITAMIN D3 PO) Take by mouth.     clonazePAM (KLONOPIN) 0.5 MG tablet Take 0.5 mg by mouth 2 (two) times daily.     Cyanocobalamin  (VITAMIN B-12 PO) Take by mouth.     desvenlafaxine (PRISTIQ) 100 MG 24 hr tablet Take 100 mg by mouth daily.     donepezil (ARICEPT) 5 MG tablet Take 5  mg by mouth daily.     memantine  (NAMENDA ) 10 MG tablet TAKE 1 TABLET BY MOUTH TWICE DAILY . APPOINTMENT REQUIRED FOR FUTURE REFILLS OR  HAVE  PRIMARY  CARE  REFILL  ONGOING 60 tablet 0   UNABLE TO FIND Med Name: CPAP     albuterol  (VENTOLIN  HFA) 108 (90 Base) MCG/ACT inhaler Inhale 2 puffs into the lungs every 4 (four) hours as needed for wheezing or shortness of breath. (Patient not taking: Reported on 05/11/2024)     budesonide-formoterol (SYMBICORT) 160-4.5 MCG/ACT inhaler Inhale 2 puffs into the lungs 2 (two) times daily. (Patient not taking: Reported on 05/11/2024)     cetirizine (ZYRTEC) 10 MG tablet Take 10 mg by mouth daily. (Patient not taking: Reported on 05/11/2024)     cyclobenzaprine (FLEXERIL) 10 MG tablet Take 10 mg by mouth 2 (two) times daily. (Patient not taking: Reported on 05/11/2024)     Melatonin 1 MG CAPS Take 1 mg by mouth at bedtime. (Patient not taking: Reported on 05/11/2024)     No facility-administered medications prior to visit.    PAST MEDICAL HISTORY: Past Medical History:  Diagnosis Date   Hyperlipemia    Hypertension    OSA (obstructive sleep apnea)     PAST SURGICAL HISTORY: Past Surgical History:  Procedure Laterality Date   CHOLECYSTECTOMY  FAMILY HISTORY: Family History  Problem Relation Age of Onset   Melanoma Mother    Breast cancer Mother    Thyroid  disease Neg Hx    Stroke Neg Hx    Seizures Neg Hx    Sleep apnea Neg Hx     SOCIAL HISTORY: Social History   Socioeconomic History   Marital status: Married    Spouse name: Not on file   Number of children: Not on file   Years of education: Not on file   Highest education level: Not on file  Occupational History   Not on file  Tobacco Use   Smoking status: Former    Current packs/day: 0.00    Average packs/day: 2.0 packs/day for 20.0 years (40.0 ttl pk-yrs)    Types: Cigarettes    Start date: 07/29/1960    Quit date: 07/29/1980    Years since quitting: 43.8   Smokeless  tobacco: Never  Vaping Use   Vaping status: Never Used  Substance and Sexual Activity   Alcohol use: Never   Drug use: Never   Sexual activity: Not on file  Other Topics Concern   Not on file  Social History Narrative   2-3 cups of caffeine daily  - 1 large yeti    Social Drivers of Corporate investment banker Strain: Not on file  Food Insecurity: Not on file  Transportation Needs: Not on file  Physical Activity: Not on file  Stress: Not on file  Social Connections: Not on file  Intimate Partner Violence: Not on file     PHYSICAL EXAM  GENERAL EXAM/CONSTITUTIONAL: Vitals:  Vitals:   05/11/24 1439  BP: 134/69  Pulse: 61  SpO2: 95%  Weight: (!) 351 lb 12.8 oz (159.6 kg)  Height: 6' 4 (1.93 m)   Body mass index is 42.82 kg/m. Wt Readings from Last 3 Encounters:  05/11/24 (!) 351 lb 12.8 oz (159.6 kg)  11/26/22 (!) 319 lb (144.7 kg)  10/08/19 (!) 360 lb 3.2 oz (163.4 kg)   Patient is in no distress; well developed, nourished and groomed; neck is supple  CARDIOVASCULAR: Examination of carotid arteries is normal; no carotid bruits Regular rate and rhythm, no murmurs Examination of peripheral vascular system by observation and palpation is normal  EYES: Ophthalmoscopic exam of optic discs and posterior segments is normal; no papilledema or hemorrhages No results found.  MUSCULOSKELETAL: Gait, strength, tone, movements noted in Neurologic exam below  NEUROLOGIC: MENTAL STATUS:     11/26/2022   11:10 AM  MMSE - Mini Mental State Exam  Orientation to time 5  Orientation to Place 5  Registration 3  Attention/ Calculation 2  Recall 0  Language- name 2 objects 2  Language- repeat 1  Language- follow 3 step command 3  Language- read & follow direction 1  Write a sentence 1  Copy design 1  Total score 24   awake, alert, oriented to person, place and time recent and remote memory intact normal attention and concentration language fluent, comprehension  intact, naming intact fund of knowledge appropriate QUIET; WITHDRAWN  CRANIAL NERVE:  2nd - no papilledema on fundoscopic exam 2nd, 3rd, 4th, 6th - pupils equal and reactive to light, visual fields full to confrontation, extraocular muscles intact, no nystagmus 5th - facial sensation symmetric 7th - facial strength symmetric 8th - hearing intact 9th - palate elevates symmetrically, uvula midline 11th - shoulder shrug symmetric 12th - tongue protrusion midline  MOTOR:  normal bulk and tone, full strength  in the BUE, BLE  SENSORY:  normal and symmetric to light touch, temperature, vibration  COORDINATION:  finger-nose-finger, fine finger movements normal  REFLEXES:  deep tendon reflexes TRACE and symmetric  GAIT/STATION:  narrow based gait     DIAGNOSTIC DATA (LABS, IMAGING, TESTING) - I reviewed patient records, labs, notes, testing and imaging myself where available.  Lab Results  Component Value Date   WBC 8.6 10/03/2016   HGB 14.4 10/03/2016   HCT 42.2 10/03/2016   MCV 85.8 10/03/2016   PLT 206.0 10/03/2016      Component Value Date/Time   NA 140 10/03/2016 0947   K 4.9 10/03/2016 0947   CL 104 10/03/2016 0947   CO2 29 10/03/2016 0947   GLUCOSE 119 (H) 10/03/2016 0947   BUN 21 10/03/2016 0947   CREATININE 0.99 10/03/2016 0947   CALCIUM 10.1 10/03/2016 0947   No results found for: CHOL, HDL, LDLCALC, LDLDIRECT, TRIG, CHOLHDL No results found for: YHAJ8R No results found for: VITAMINB12 Lab Results  Component Value Date   TSH 1.94 10/03/2016   11/26/22  Component Ref Range & Units (hover) 1 yr ago  A -- Beta-amyloid 42/40 Ratio 0.095 Low   Beta-amyloid 42 18.66  Beta-amyloid 40 196.10  T -- p-tau181 1.02 High   N -- NfL, Plasma 2.00  ATN SUMMARY Comment  Comment:                        A+ T+ N- A low beta-amyloid 42/40 and a high pTau181 concentration were observed. A normal NfL concentration was observed at this time. These  results are consistent with the presence of Alzheimer's related pathology.    04/05/24 MRI brain  There are multiple T2 hyperintensities within the cerebral white matter. These abnormalities are nonspecific and of uncertain etiology or clinical significance. They are more common in older patients and patients with risk factors for vascular disease. There is no acute infarct or other significant abnormality  04/05/24 MRA head - Normal    ASSESSMENT AND PLAN  73 y.o. year old male here with:   Dx:  1. Sialorrhea   2. Dysphagia, unspecified type   3. Moderate late onset Alzheimer's dementia without behavioral disturbance, psychotic disturbance, mood disturbance, or anxiety (HCC)      PLAN:  SIALORRHEA / DYSPHAGIA  - unclear cause; no sign of stroke - follow up GI evaluation; then consider swallow study  Mild-moderate dementia due to alzheimer's disease (since ~2020 / 2021; progression of sxs and decline in ADLs) - continue memantine  10mg  twice a day  - continue donepezil 10mg  daily - safety / supervision issues reviewed - daily physical activity / exercise (at least 15-30 minutes) - eat more plants / vegetables - increase social activities, brain stimulation, games, puzzles, hobbies, crafts, arts, music - aim for at least 7-8 hours sleep per night (or more) - avoid smoking and alcohol - caregiver resources provided ([email or web address]) - caution with medications, finances; no driving  Return for return to PCP, pending if symptoms worsen or fail to improve.    EDUARD FABIENE HANLON, MD 05/11/2024, 3:22 PM Certified in Neurology, Neurophysiology and Neuroimaging  [facility] [address], [address] [place], [place] [address] [phone]

## 2024-05-11 NOTE — Patient Instructions (Signed)
  SIALORRHEA / DYSPHAGIA  - unclear cause; no sign of stroke - follow up GI evaluation; then consider swallow study

## 2024-06-26 ENCOUNTER — Encounter: Payer: Self-pay | Admitting: Gastroenterology
